# Patient Record
Sex: Female | Born: 1995 | Race: Black or African American | Hispanic: No | Marital: Single | Smoking: Current every day smoker
Health system: Southern US, Community
[De-identification: ages and names within clinical notes are randomized; demographics above are authoritative.]

---

## 2017-06-11 ENCOUNTER — Emergency Department (HOSPITAL_COMMUNITY)
Admission: EM | Admit: 2017-06-11 | Discharge: 2017-06-11 | Disposition: A | Discharge status: Home / Self Care | Payer: Self-pay | Attending: Emergency Medicine | Admitting: Emergency Medicine

## 2017-06-11 ENCOUNTER — Emergency Department (HOSPITAL_COMMUNITY): Payer: Self-pay

## 2017-06-11 ENCOUNTER — Encounter (HOSPITAL_COMMUNITY): Payer: Self-pay | Admitting: Emergency Medicine

## 2017-06-11 DIAGNOSIS — S43004A Unspecified dislocation of right shoulder joint, initial encounter: Secondary | ICD-10-CM | POA: Insufficient documentation

## 2017-06-11 DIAGNOSIS — Y939 Activity, unspecified: Secondary | ICD-10-CM | POA: Insufficient documentation

## 2017-06-11 DIAGNOSIS — F17200 Nicotine dependence, unspecified, uncomplicated: Secondary | ICD-10-CM | POA: Insufficient documentation

## 2017-06-11 DIAGNOSIS — Y999 Unspecified external cause status: Secondary | ICD-10-CM | POA: Insufficient documentation

## 2017-06-11 DIAGNOSIS — W19XXXA Unspecified fall, initial encounter: Secondary | ICD-10-CM | POA: Insufficient documentation

## 2017-06-11 DIAGNOSIS — Y929 Unspecified place or not applicable: Secondary | ICD-10-CM | POA: Insufficient documentation

## 2017-06-11 MED ORDER — HYDROMORPHONE HCL 1 MG/ML IJ SOLN
1.0000 mg | Freq: Once | INTRAMUSCULAR | Status: AC
  Administered 2017-06-11: 1 mg via INTRAVENOUS
  Filled 2017-06-11: qty 1

## 2017-06-11 MED ORDER — HYDROCODONE-ACETAMINOPHEN 5-325 MG PO TABS: 1.0000 | ORAL_TABLET | Freq: Four times a day (QID) | ORAL | 0 refills | Status: AC | PRN

## 2017-06-11 MED ORDER — NAPROXEN 250 MG PO TABS: ORAL_TABLET | ORAL | 0 refills | Status: DC

## 2017-06-11 MED ORDER — ONDANSETRON HCL 4 MG/2ML IJ SOLN
4.0000 mg | Freq: Once | INTRAMUSCULAR | Status: AC
  Administered 2017-06-11: 4 mg via INTRAVENOUS
  Filled 2017-06-11: qty 2

## 2017-06-11 MED ORDER — PROPOFOL 10 MG/ML IV BOLUS
200.0000 mg | Freq: Once | INTRAVENOUS | Status: AC
  Administered 2017-06-11: 40 mg via INTRAVENOUS
  Filled 2017-06-11: qty 20

## 2017-06-11 NOTE — ED Notes (Signed)
Bed: [id number] Expected date:  Expected time:  Means of arrival:  Comments: Shoulder injury

## 2017-06-11 NOTE — ED Notes (Signed)
Pt removed her right arm sling.
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
## 2017-06-11 NOTE — ED Notes (Signed)
Another dose of 20 mg of propofol given at this time (for a total of 100 mg given).
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
## 2017-06-11 NOTE — Discharge Instructions (Signed)
Wear the shoulder immobilizer for the next 2 weeks. Take the medications as prescribed. You need to follow through to see an orthopedist to have your shoulder repaired.

## 2018-02-18 ENCOUNTER — Emergency Department (HOSPITAL_COMMUNITY): Payer: Self-pay

## 2018-02-18 ENCOUNTER — Emergency Department (HOSPITAL_COMMUNITY)
Admission: EM | Admit: 2018-02-18 | Discharge: 2018-02-18 | Disposition: A | Discharge status: Home / Self Care | Payer: Self-pay | Attending: Emergency Medicine | Admitting: Emergency Medicine

## 2018-02-18 ENCOUNTER — Encounter (HOSPITAL_COMMUNITY): Payer: Self-pay

## 2018-02-18 ENCOUNTER — Other Ambulatory Visit: Payer: Self-pay

## 2018-02-18 DIAGNOSIS — S43004A Unspecified dislocation of right shoulder joint, initial encounter: Secondary | ICD-10-CM

## 2018-02-18 DIAGNOSIS — Y929 Unspecified place or not applicable: Secondary | ICD-10-CM | POA: Insufficient documentation

## 2018-02-18 DIAGNOSIS — W1789XA Other fall from one level to another, initial encounter: Secondary | ICD-10-CM | POA: Insufficient documentation

## 2018-02-18 DIAGNOSIS — Y999 Unspecified external cause status: Secondary | ICD-10-CM | POA: Insufficient documentation

## 2018-02-18 DIAGNOSIS — F17200 Nicotine dependence, unspecified, uncomplicated: Secondary | ICD-10-CM | POA: Insufficient documentation

## 2018-02-18 DIAGNOSIS — Y939 Activity, unspecified: Secondary | ICD-10-CM | POA: Insufficient documentation

## 2018-02-18 MED ORDER — PROPOFOL 10 MG/ML IV BOLUS
60.0000 mg | Freq: Once | INTRAVENOUS | Status: AC
Start: 2018-02-18 — End: 2018-02-18
  Administered 2018-02-18: 60 mg via INTRAVENOUS
  Filled 2018-02-18: qty 20

## 2018-02-18 MED ORDER — FENTANYL CITRATE (PF) 100 MCG/2ML IJ SOLN
150.0000 ug | Freq: Once | INTRAMUSCULAR | Status: AC
  Administered 2018-02-18: 150 ug via INTRAVENOUS
  Filled 2018-02-18: qty 4

## 2018-02-18 NOTE — Discharge Instructions (Signed)
Please follow up with an ortho. You will likely need to have a repair.  Return for repeat episode.   Take 4 over the counter ibuprofen tablets 3 times a day or 2 over-the-counter naproxen tablets twice a day for pain. Also take tylenol 1000mg (2 extra strength) four times a day.
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
## 2018-07-25 ENCOUNTER — Emergency Department (HOSPITAL_COMMUNITY)
Admission: EM | Admit: 2018-07-25 | Discharge: 2018-07-25 | Disposition: A | Discharge status: Home / Self Care | Payer: Self-pay | Attending: Emergency Medicine | Admitting: Emergency Medicine

## 2018-07-25 ENCOUNTER — Encounter (HOSPITAL_COMMUNITY): Payer: Self-pay

## 2018-07-25 ENCOUNTER — Ambulatory Visit (HOSPITAL_COMMUNITY)
Admission: EM | Admit: 2018-07-25 | Discharge: 2018-07-25 | Disposition: A | Discharge status: Home / Self Care | Payer: No Typology Code available for payment source | Source: Ambulatory Visit | Attending: Emergency Medicine | Admitting: Emergency Medicine

## 2018-07-25 DIAGNOSIS — Z0441 Encounter for examination and observation following alleged adult rape: Secondary | ICD-10-CM | POA: Diagnosis not present

## 2018-07-25 DIAGNOSIS — T7421XA Adult sexual abuse, confirmed, initial encounter: Secondary | ICD-10-CM | POA: Diagnosis not present

## 2018-07-25 MED ORDER — ULIPRISTAL ACETATE 30 MG PO TABS
30.0000 mg | ORAL_TABLET | Freq: Once | ORAL | Status: AC
  Administered 2018-07-25: 30 mg via ORAL
  Filled 2018-07-25: qty 1

## 2018-07-25 MED ORDER — AZITHROMYCIN 250 MG PO TABS
1000.0000 mg | ORAL_TABLET | Freq: Once | ORAL | Status: AC
  Administered 2018-07-25: 1000 mg via ORAL
  Filled 2018-07-25: qty 4

## 2018-07-25 MED ORDER — CEFTRIAXONE SODIUM 250 MG IJ SOLR
250.0000 mg | Freq: Once | INTRAMUSCULAR | Status: AC
  Administered 2018-07-25: 250 mg via INTRAMUSCULAR
  Filled 2018-07-25: qty 250

## 2018-07-25 MED ORDER — LIDOCAINE HCL (PF) 1 % IJ SOLN
0.9000 mL | Freq: Once | INTRAMUSCULAR | Status: AC
  Administered 2018-07-25: 0.9 mL
  Filled 2018-07-25: qty 5

## 2018-07-25 MED ORDER — METRONIDAZOLE 500 MG PO TABS
2000.0000 mg | ORAL_TABLET | Freq: Once | ORAL | Status: AC
  Administered 2018-07-25: 2000 mg via ORAL
  Filled 2018-07-25: qty 4
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
## 2018-07-25 NOTE — Discharge Instructions (Signed)
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 non-prescription drugs, or dietary supplements you use. Also tell them if you smoke, drink alcohol, or use illegal drugs. Some items may interact with your medicine. What should I watch for while using this medicine? Tell your doctor or health care  professional if your symptoms do not improve or if they get worse. Do not treat diarrhea with over the counter products. Contact your doctor if you have diarrhea that lasts more than 2 days or if it is severe and watery. If you are being treated for a sexually transmitted disease, avoid sexual contact until you have finished your treatment. Having sex can infect your sexual partner. Calcium may bind to this medicine and cause lung or kidney problems. Avoid calcium products while taking this medicine and for 48 hours after taking the last dose of this medicine. What side effects may  I notice from receiving this medicine? Side effects that you should report to your doctor or health care professional as soon as possible: -allergic reactions like skin rash, itching or hives, swelling of the face, lips, or tongue -breathing problems -fever, chills -irregular heartbeat -pain when passing urine -seizures -stomach pain, cramps -unusual bleeding, bruising -unusually weak or tired Side effects that usually do not require medical attention (report to your doctor or health care professional if they continue or are bothersome): -diarrhea -dizzy, drowsy -headache -nausea, vomiting -pain, swelling, irritation where injected -stomach upset -sweating This list may not describe all possible side effects. Call your doctor for medical advice about side effects. You may report side effects to FDA at 1-800-FDA-1088. Where should I keep my medicine? Keep out of the reach of children. Store at room temperature below 25 degrees C (77 degrees F). Protect from light. Throw away any unused vials after the expiration date. NOTE: This sheet is a summary. It may not cover all possible information. If you have questions about this medicine, talk to your doctor, pharmacist, or health care provider.  2017 Elsevier/Gold Standard (2014-06-03 09:14:54)
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
## 2018-07-25 NOTE — ED Notes (Signed)
Megan Greene, SANE nurse contacted.

## 2018-08-27 ENCOUNTER — Encounter (HOSPITAL_COMMUNITY): Payer: Self-pay | Admitting: Emergency Medicine

## 2018-08-27 ENCOUNTER — Emergency Department (HOSPITAL_COMMUNITY): Payer: Self-pay

## 2018-08-27 ENCOUNTER — Emergency Department (HOSPITAL_COMMUNITY)
Admission: EM | Admit: 2018-08-27 | Discharge: 2018-08-27 | Disposition: A | Discharge status: Home / Self Care | Payer: Self-pay | Attending: Emergency Medicine | Admitting: Emergency Medicine

## 2018-08-27 DIAGNOSIS — Y9389 Activity, other specified: Secondary | ICD-10-CM | POA: Insufficient documentation

## 2018-08-27 DIAGNOSIS — Y999 Unspecified external cause status: Secondary | ICD-10-CM | POA: Insufficient documentation

## 2018-08-27 DIAGNOSIS — Y92019 Unspecified place in single-family (private) house as the place of occurrence of the external cause: Secondary | ICD-10-CM | POA: Insufficient documentation

## 2018-08-27 DIAGNOSIS — X509XXA Other and unspecified overexertion or strenuous movements or postures, initial encounter: Secondary | ICD-10-CM | POA: Insufficient documentation

## 2018-08-27 DIAGNOSIS — F17200 Nicotine dependence, unspecified, uncomplicated: Secondary | ICD-10-CM | POA: Insufficient documentation

## 2018-08-27 DIAGNOSIS — S43004A Unspecified dislocation of right shoulder joint, initial encounter: Secondary | ICD-10-CM | POA: Insufficient documentation

## 2018-08-27 MED ORDER — FENTANYL CITRATE (PF) 100 MCG/2ML IJ SOLN
50.0000 ug | INTRAMUSCULAR | Status: DC | PRN
  Administered 2018-08-27: 50 ug via INTRAVENOUS
  Filled 2018-08-27: qty 2

## 2018-08-27 MED ORDER — HYDROCODONE-ACETAMINOPHEN 5-325 MG PO TABS: 1.0000 | ORAL_TABLET | Freq: Once | ORAL | Status: DC

## 2018-08-27 MED ORDER — ONDANSETRON HCL 4 MG/2ML IJ SOLN
4.0000 mg | Freq: Once | INTRAMUSCULAR | Status: AC
  Administered 2018-08-27: 4 mg via INTRAVENOUS
  Filled 2018-08-27: qty 2

## 2018-08-27 MED ORDER — SODIUM CHLORIDE 0.9 % IV BOLUS
1000.0000 mL | Freq: Once | INTRAVENOUS | Status: AC
  Administered 2018-08-27: 1000 mL via INTRAVENOUS

## 2018-08-27 MED ORDER — PROPOFOL 10 MG/ML IV BOLUS
28.8000 mg | Freq: Once | INTRAVENOUS | Status: AC
  Administered 2018-08-27: 2.88 mg via INTRAVENOUS
  Filled 2018-08-27: qty 20

## 2018-08-27 MED ORDER — HYDROCODONE-ACETAMINOPHEN 5-325 MG PO TABS: 1.0000 | ORAL_TABLET | Freq: Four times a day (QID) | ORAL | 0 refills | Status: AC | PRN

## 2018-08-27 MED ORDER — HYDROCODONE-ACETAMINOPHEN 5-325 MG PO TABS
2.0000 | ORAL_TABLET | Freq: Once | ORAL | Status: AC
  Administered 2018-08-27: 2 via ORAL
  Filled 2018-08-27: qty 2
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
## 2018-08-27 NOTE — ED Provider Notes (Signed)
Medical screening examination/treatment/procedure(s) were conducted as a shared visit with non-physician practitioner(s) and myself.  I personally evaluated the patient during the encounter. Briefly, the patient is a 22 y.o. female who presents to the ED with right shoulder dislocation.  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  Has been told that she needed surgery for this but has not been able to afford it.  Patient states that shoulder just came out with basic movement today.  Patient is neurovascularly intact on exam.  X-ray confirmed dislocation.  IV pain medicine was given and patient was unable to tolerate fares technique therefore propofol sedation was performed by myself and shoulder reduction was successfully done by physician assistant.  Postreduction x-rays showed improved alignment.  Patient was placed in a sling.  Patient told to follow-up with orthopedic surgery.  Patient neurovascularly intact after reduction.  Improved pain.  Hemodynamically stable throughout sedation and afterwards.  Patient did have family member that was concerned about our care as we did not provide pain relief fast enough.  Had a long discussion with family member and the patient and explained to them about the difficulty in arranging for procedural sedation and reduction.  Otherwise patient was discharged in good condition.  Given return precautions.  This chart was dictated using voice recognition software.  Despite best efforts to proofread,  errors can occur which can change the documentation meaning.    EKG Interpretation None        .Sedation Date/Time: 08/27/2018 3:52 PM Performed by: Virgina Norfolk, DO Authorized by: Virgina Norfolk, DO   Consent:    Consent obtained:  Written and verbal   Consent given by:  Patient   Risks discussed:  Allergic reaction, dysrhythmia, inadequate sedation, nausea, prolonged hypoxia resulting in  organ damage, prolonged sedation necessitating reversal, respiratory compromise necessitating ventilatory assistance and intubation and vomiting   Alternatives discussed:  Analgesia without sedation and anxiolysis Universal protocol:    Procedure explained and questions answered to patient or proxy's satisfaction: yes     Relevant documents present and verified: yes     Test results available and properly labeled: yes     Imaging studies available: yes     Immediately prior to procedure a time out was called: yes     Patient identity confirmation method:  Arm band and verbally with patient Indications:    Procedure performed:  Dislocation reduction   Procedure necessitating sedation performed by:  Physician performing sedation   Intended level of sedation:  Moderate (conscious sedation) Pre-sedation assessment:    Time since last food or drink:  4 hours   NPO status caution: urgency dictates proceeding with non-ideal NPO status     ASA classification: class 1 - normal, healthy patient     Neck mobility: normal     Mouth opening:  3 or more finger widths   Thyromental distance:  4 finger widths   Mallampati score:  I - soft palate, uvula, fauces, pillars visible   Pre-sedation assessments completed and reviewed: airway patency, cardiovascular function, hydration status, mental status, nausea/vomiting, pain level, respiratory function and temperature     Pre-sedation assessment completed:  08/27/2018 3:03 PM Immediate pre-procedure details:    Reviewed: vital signs, relevant labs/tests and NPO status     Verified: bag valve mask available, emergency equipment available, intubation equipment available, IV patency confirmed, oxygen available and suction available   Procedure details (see MAR for exact dosages):  Preoxygenation:  Nasal cannula   Sedation:  Propofol   Intra-procedure monitoring:  Blood pressure monitoring, cardiac monitor, continuous capnometry, continuous pulse oximetry,  frequent LOC assessments and frequent vital sign checks   Intra-procedure events: none     Intra-procedure management:  Fluid bolus   Total Provider sedation time (minutes):  10 Post-procedure details:    Post-sedation assessment completed:  08/27/2018 3:55 PM   Attendance: Constant attendance by certified staff until patient recovered     Recovery: Patient returned to pre-procedure baseline     Post-sedation assessments completed and reviewed: airway patency, cardiovascular function, hydration status, mental status, nausea/vomiting, pain level, respiratory function and temperature     Patient is stable for discharge or admission: yes     Patient tolerance:  Tolerated well, no immediate complications     Virgina Norfolk, DO 08/27/18 1853

## 2018-08-27 NOTE — ED Triage Notes (Addendum)
Pt c/o R shoulder pain, R shoulder "popped out" of place. Pt given Fentanyl by EMS.
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
## 2018-08-27 NOTE — Discharge Instructions (Signed)
You can take Tylenol or Ibuprofen as directed for pain. You can alternate Tylenol and Ibuprofen every 4 hours. If you take Tylenol at 1pm, then you can take Ibuprofen at 5pm. Then you can take Tylenol again at 9pm.   Take pain medication for severe or breakthrough pain.   Follow-up with referred orthopedic doctor for further evaluation.  Keep the sling on to help support and stabilize the shoulder.  Return to emergency department for worsening pain, numbness/weakness, discoloration of the shoulder or any other worsening or concerning symptoms.

## 2018-08-27 NOTE — ED Provider Notes (Signed)
[facility]-EMERGENCY DEPT Provider Note   CSN: [id number] Arrival date & time: 08/27/18  1352     History   Chief Complaint Chief Complaint  Patient presents with  . Shoulder Pain    r    HPI Megan Greene is a 22 y.o. female with possible history of shoulder dislocations who presents for evaluation of right shoulder pain.  Patient reports that she was sitting in her house and states that she moved in her right shoulder "popped out of place."  She denies any trauma, injury.  She reports that this is happened previously.  Last time was in April 2019.  She states she has been seen by an orthopedic doctor before but otherwise has not had any other follow-up.  She does not remember who she had seen previously.  Patient was given fentanyl in route.  She reports she is still having significant pain.  She states that she has good distal sensation to her fingertips but feels like the right shoulder is numb.  The history is provided by the patient.    History reviewed. No pertinent past medical history.  There are no active problems to display for this patient.   History reviewed. No pertinent surgical history.   OB History   None      Home Medications    Prior to Admission medications   Medication Sig Start Date End Date Taking? Authorizing Provider  HYDROcodone-acetaminophen (NORCO/VICODIN) 5-325 MG tablet Take 1 tablet by mouth every 6 (six) hours as needed. Patient not taking: Reported on 02/18/2018 06/11/17   Devoria Albe, MD  HYDROcodone-acetaminophen (NORCO/VICODIN) 5-325 MG tablet Take 1-2 tablets by mouth every 6 (six) hours as needed. 08/27/18   Maxwell Caul, PA-C  naproxen (NAPROSYN) 250 MG tablet Take 1 po BID with food prn pain Patient not taking: Reported on 02/18/2018 06/11/17   Devoria Albe, MD    Family History History reviewed. No pertinent family history.  Social History Social History   Tobacco Use  . Smoking status: Current Every Day  Smoker  . Smokeless tobacco: Never Used  Substance Use Topics  . Alcohol use: No  . Drug use: No     Allergies   Patient has no known allergies.   Review of Systems Review of Systems  Musculoskeletal:       Right shoulder pain  Neurological: Positive for numbness (right shoulder ).  All other systems reviewed and are negative.    Physical Exam Updated Vital Signs BP 124/85 (BP Location: Right Arm)   Pulse 78   Temp 98.4 F (36.9 C) (Oral)   Resp 12   Wt 56.7 kg   LMP 08/20/2018 (Exact Date)   SpO2 100%   BMI 19.58 kg/m   Physical Exam  Constitutional: She appears well-developed and well-nourished.  HENT:  Head: Normocephalic and atraumatic.  Eyes: Conjunctivae and EOM are normal. Right eye exhibits no discharge. Left eye exhibits no discharge. No scleral icterus.  Cardiovascular:  Pulses:      Radial pulses are 2+ on the right side, and 2+ on the left side.  Pulmonary/Chest: Effort normal and breath sounds normal.  Lungs clear to auscultation bilaterally.  Symmetric chest rise.  No wheezing, rales, rhonchi.  Musculoskeletal:  Right shoulder with obvious deformity.  No tenderness palpation right elbow, right wrist, right hand.  Patient able to move all 5 digits right hand without any difficulty.  Limited range of motion of right upper extremity secondary to pain.  Full range  of motion of left upper extremity with any difficulty.  No tenderness palpation noted left shoulder, left elbow, left wrist.  Neurological: She is alert.  Good distal sensation to the right digits.  Reports of decreased sensation noted in the right shoulder.  Skin: Skin is warm and dry. Capillary refill takes less than 2 seconds.  Good distal cap refill. RUE is not dusky in appearance or cool to touch.  Psychiatric: She has a normal mood and affect. Her speech is normal and behavior is normal.  Nursing note and vitals reviewed.    ED Treatments / Results  Labs (all labs ordered are listed,  but only abnormal results are displayed) Labs Reviewed - No data to display  EKG None  Radiology Dg Shoulder Right  Result Date: 08/27/2018 CLINICAL DATA:  Right shoulder deformity.  Popped out of place. EXAM: RIGHT SHOULDER - 2+ VIEW COMPARISON:  None. FINDINGS: Acute anterior shoulder dislocation. No acute fracture. Flattening of the superior posterolateral humeral head consistent with a Hill-Sachs lesion. No aggressive osseous lesion. IMPRESSION: Acute anterior shoulder dislocation. Electronically Signed   By: Elige Ko   On: 08/27/2018 15:18   Dg Shoulder Right Portable  Result Date: 08/27/2018 CLINICAL DATA:  Reduction of right shoulder dislocation EXAM: PORTABLE RIGHT SHOULDER COMPARISON:  None. FINDINGS: Interval reduction of right shoulder dislocation with satisfactory position of the humeral head relative to the glenoid. No acute fracture. No persistent dislocation. Normal acromioclavicular joint. IMPRESSION: Successful interval reduction of right shoulder dislocation with satisfactory position of the humeral head relative to the glenoid. Electronically Signed   By: Elige Ko   On: 08/27/2018 16:19    Procedures Reduction of dislocation Date/Time: 08/27/2018 3:40 PM Performed by: Maxwell Caul, PA-C Authorized by: Maxwell Caul, PA-C  Consent: Verbal consent obtained. Consent given by: patient Patient understanding: patient states understanding of the procedure being performed Patient consent: the patient's understanding of the procedure matches consent given Procedure consent: procedure consent matches procedure scheduled Relevant documents: relevant documents present and verified Test results: test results available and properly labeled Site marked: the operative site was marked Imaging studies: imaging studies available Patient identity confirmed: verbally with patient Time out: Immediately prior to procedure a "time out" was called to verify the correct  patient, procedure, equipment, support staff and site/side marked as required.  Sedation: Patient sedated: yes Sedatives: propofol Vitals: Vital signs were monitored during sedation.  Patient tolerance: Patient tolerated the procedure well with no immediate complications Comments: We initially had tried reduction of the right shoulder without sedation.  Patient was unable to tolerate procedure and was terminated due to pain.  Patient was sedated with propofol.  Please see ED attending note for full sedation no.  Once patient was sedated, one attempt at reduction was made which was successful.  Patient was immediately placed in arm sling for support and stabilization.    (including critical care time)  Medications Ordered in ED Medications  fentaNYL (SUBLIMAZE) injection 50 mcg (50 mcg Intravenous Given 08/27/18 1433)  propofol (DIPRIVAN) 10 mg/mL bolus/IV push 28.8 mg (2.88 mg Intravenous Given 08/27/18 1530)  sodium chloride 0.9 % bolus 1,000 mL (0 mLs Intravenous Stopped 08/27/18 1629)  ondansetron (ZOFRAN) injection 4 mg (4 mg Intravenous Given 08/27/18 1529)  HYDROcodone-acetaminophen (NORCO/VICODIN) 5-325 MG per tablet 2 tablet (2 tablets Oral Given 08/27/18 1631)     Initial Impression / Assessment and Plan / ED Course  I have reviewed the triage vital signs and the nursing notes.  Pertinent labs & imaging results that were available during my care of the patient were reviewed by me and considered in my medical decision making (see chart for details).     22 year old female who presents for evaluation of right shoulder dislocation.  Reports a previous history.  Was sitting and turn and felt her shoulder pop out of place.  Reports some numbness of the shoulder.  No distal sensory deficits. Patient is afebrile, non-toxic appearing, sitting comfortably on examination table. Vital signs reviewed and stable.  Radial pulse and distal cap refill intact without any difficulty.  Patient obvious  deformity to right shoulder.  Concern for right shoulder dislocation.  Will give x-ray.  Patient given fentanyl in route.  Will give additional dose of fentanyl.  X-ray reviewed.  It is positive for dislocation.  We will plan to reduce.  Dr. Lockie Mola and I went to the room to evaluate patient.  We attempted to immediately reduce the shoulder to reduce pain.  We are unsuccessful and had to stop given patient's inability to tolerate the pain.  We discussed with patient regarding sedation for reduction.  Patient agreed and consented.  Discussed with patient risk first benefits of sedation.  Patient accepted all risk first benefits and wishes for sedation for procedure.   Procedure performed as documented above.  Patient tolerated procedure well.  She was immediately placed in arm sling for support and stabilization.  Post reduction x-ray shows successful reduction.  Reevaluation patient.  She is alert and talking.  She is walking in the ED with any difficulty.  Vital signs are stable.  She is requesting something for pain.  We will give her p.o. pain medication.  Reevaluation.  Patient with good distal sensation, vascularization.  We will plan to give her outpatient orthopedic referral for her to follow-up with given recurrence shoulder dislocations.  Encourage at home supportive care measures.  Requesting something for pain to go home with.  Patient reviewed on PMP.  Last narcotic prescription was in April 2018.  Patient had ample opportunity for questions and discussion. All patient's questions were answered with full understanding. Strict return precautions discussed. Patient expresses understanding and agreement to plan.   Portions of this note were generated with Scientist, clinical (histocompatibility and immunogenetics). Dictation errors may occur despite best attempts at proofreading.   Final Clinical Impressions(s) / ED Diagnoses   Final diagnoses:  Closed dislocation of right shoulder, initial encounter    ED  Discharge Orders         Ordered    HYDROcodone-acetaminophen (NORCO/VICODIN) 5-325 MG tablet  Every 6 hours PRN     08/27/18 1634           Maxwell Caul, PA-C 08/28/18 0049    Virgina Norfolk, DO 08/29/18 1610

## 2019-04-22 ENCOUNTER — Encounter (HOSPITAL_COMMUNITY): Payer: Self-pay | Admitting: Emergency Medicine

## 2019-04-22 ENCOUNTER — Other Ambulatory Visit: Payer: Self-pay

## 2019-04-22 ENCOUNTER — Emergency Department (HOSPITAL_COMMUNITY)
Admission: EM | Admit: 2019-04-22 | Discharge: 2019-04-22 | Disposition: A | Discharge status: Home / Self Care | Payer: Self-pay | Attending: Emergency Medicine | Admitting: Emergency Medicine

## 2019-04-22 DIAGNOSIS — R519 Headache, unspecified: Secondary | ICD-10-CM

## 2019-04-22 DIAGNOSIS — F17200 Nicotine dependence, unspecified, uncomplicated: Secondary | ICD-10-CM | POA: Insufficient documentation

## 2019-04-22 DIAGNOSIS — R51 Headache: Secondary | ICD-10-CM | POA: Insufficient documentation

## 2019-04-22 MED ORDER — PROCHLORPERAZINE EDISYLATE 10 MG/2ML IJ SOLN
10.0000 mg | Freq: Once | INTRAMUSCULAR | Status: AC
  Administered 2019-04-22: 10 mg via INTRAMUSCULAR
  Filled 2019-04-22: qty 2

## 2019-04-22 MED ORDER — PROCHLORPERAZINE EDISYLATE 10 MG/2ML IJ SOLN: 10.0000 mg | Freq: Once | INTRAMUSCULAR | Status: DC

## 2019-04-22 MED ORDER — KETOROLAC TROMETHAMINE 60 MG/2ML IM SOLN
60.0000 mg | Freq: Once | INTRAMUSCULAR | Status: AC
  Administered 2019-04-22: 60 mg via INTRAMUSCULAR
  Filled 2019-04-22: qty 2

## 2019-04-22 NOTE — Discharge Instructions (Signed)
I have attached some information for local neurology offices.  I would follow-up if you continue having ongoing headaches. Return here for any new/acute changes.

## 2019-04-22 NOTE — ED Notes (Signed)
Patient called ride, will leave when they arrive.

## 2019-04-22 NOTE — ED Notes (Signed)
Bed: TX64 Expected date:  Expected time:  Means of arrival:  Comments: 34 F headache x 2 months

## 2019-04-22 NOTE — ED Provider Notes (Signed)
[facility]-EMERGENCY DEPT Provider Note   CSN: [id number] Arrival date & time: 04/22/19  0626    History   Chief Complaint Chief Complaint  Patient presents with  . Headache    HPI Megan Greene is a 23 y.o. female.     The history is provided by the patient and medical records.  Headache     23 y.o. F here with headache.  States this has been an intermittent issues for about 2-3 months.  States pain localized to left side of her head, throbbing in nature.  States she has associated photophobia and some intermittent nausea.  No numbness, weakness, confusion, neck pain, stiffness, dizziness, or fever.  States she has been taking Excedrin for headache which does provide relief for a while.  States she has never seen a neurologist.  No history of migraines.  Last dose of meds around 5am-- took excedrin, tylenol, benadryl, and oxycodone from "friend".  No past medical history on file.  There are no active problems to display for this patient.   History reviewed. No pertinent surgical history.   OB History   No obstetric history on file.      Home Medications    Prior to Admission medications   Medication Sig Start Date End Date Taking? Authorizing Provider  HYDROcodone-acetaminophen (NORCO/VICODIN) 5-325 MG tablet Take 1 tablet by mouth every 6 (six) hours as needed. Patient not taking: Reported on 02/18/2018 06/11/17   Devoria Albe, MD  HYDROcodone-acetaminophen (NORCO/VICODIN) 5-325 MG tablet Take 1-2 tablets by mouth every 6 (six) hours as needed. 08/27/18   Maxwell Caul, PA-C  naproxen (NAPROSYN) 250 MG tablet Take 1 po BID with food prn pain Patient not taking: Reported on 02/18/2018 06/11/17   Devoria Albe, MD    Family History No family history on file.  Social History Social History   Tobacco Use  . Smoking status: Current Every Day Smoker  . Smokeless tobacco: Never Used  Substance Use Topics  . Alcohol use: No  . Drug use: No      Allergies   Patient has no known allergies.   Review of Systems Review of Systems  Neurological: Positive for headaches.  All other systems reviewed and are negative.    Physical Exam Updated Vital Signs BP 115/88 (BP Location: Right Arm)   Pulse 86   Temp 98.2 F (36.8 C) (Oral)   Resp 16   SpO2 100%   Physical Exam Vitals signs and nursing note reviewed.  Constitutional:      General: She is not in acute distress.    Appearance: She is well-developed. She is not diaphoretic.  HENT:     Head: Normocephalic and atraumatic.     Right Ear: External ear normal.     Left Ear: External ear normal.  Eyes:     Conjunctiva/sclera: Conjunctivae normal.     Pupils: Pupils are equal, round, and reactive to light.  Neck:     Musculoskeletal: Full passive range of motion without pain, normal range of motion and neck supple. No neck rigidity.     Comments: No rigidity, no meningismus Cardiovascular:     Rate and Rhythm: Normal rate and regular rhythm.     Heart sounds: Normal heart sounds. No murmur.  Pulmonary:     Effort: Pulmonary effort is normal. No respiratory distress.     Breath sounds: Normal breath sounds. No wheezing or rhonchi.  Abdominal:     General: Bowel sounds are normal.  Palpations: Abdomen is soft.     Tenderness: There is no abdominal tenderness. There is no guarding.  Musculoskeletal: Normal range of motion.  Skin:    General: Skin is warm and dry.     Findings: No rash.  Neurological:     Mental Status: She is alert and oriented to person, place, and time.     Cranial Nerves: No cranial nerve deficit.     Sensory: No sensory deficit.     Motor: No tremor or seizure activity.     Comments: AAOx3, answering questions and following commands appropriately; equal strength UE and LE bilaterally; CN grossly intact; moves all extremities appropriately without ataxia; no focal neuro deficits or facial asymmetry appreciated  Psychiatric:        Behavior:  Behavior normal.        Thought Content: Thought content normal.      ED Treatments / Results  Labs (all labs ordered are listed, but only abnormal results are displayed) Labs Reviewed - No data to display  EKG None  Radiology No results found.  Procedures Procedures (including critical care time)  Medications Ordered in ED Medications  ketorolac (TORADOL) injection 60 mg (60 mg Intramuscular Given 04/22/19 0546)  prochlorperazine (COMPAZINE) injection 10 mg (10 mg Intramuscular Given 04/22/19 0546)     Initial Impression / Assessment and Plan / ED Course  I have reviewed the triage vital signs and the nursing notes.  Pertinent labs & imaging results that were available during my care of the patient were reviewed by me and considered in my medical decision making (see chart for details).  23 y.o. F here with headaches x2-3 months.  States usually resolves with excedrin but did not work last night.  States she has not been able to sleep due to pain.  Headache is left sided, throbbing, with associated photophobia and some mild nausea.  No vomiting, dizziness, confusion, numbness, neck pain/stiffness, or fever.  Patient is awake, alert, appropriately oriented.  She does not have any focal neurologic deficits.  No signs or symptoms concerning for meningitis.  Will give migraine cocktail and reassess.  6:31 AM Patient resting comfortably.  VSS.  Feel she is stable for discharge.  Have given her information for local neurology clinics for follow-up if needed for recurrent/persistent headaches.  She can return here for any new/acute changes.  Final Clinical Impressions(s) / ED Diagnoses   Final diagnoses:  Bad headache    ED Discharge Orders    None       Garlon HatchetSanders,  M, PA-C 04/22/19 [id number]    Shon BatonHorton, Courtney F, MD 04/22/19 [phone]

## 2019-04-22 NOTE — ED Triage Notes (Addendum)
Arrives via EMS from home. L sided headache for 2-3 months. Took Excedrin, Benadryl, Tylenol and a non-prescribed oxycodone with no relief around 9 pm last night.

## 2019-05-09 ENCOUNTER — Encounter (HOSPITAL_COMMUNITY): Payer: Self-pay | Admitting: Emergency Medicine

## 2019-05-09 ENCOUNTER — Other Ambulatory Visit: Payer: Self-pay

## 2019-05-09 ENCOUNTER — Emergency Department (HOSPITAL_COMMUNITY)
Admission: EM | Admit: 2019-05-09 | Discharge: 2019-05-10 | Disposition: A | Discharge status: Home / Self Care | Payer: Self-pay | Attending: Emergency Medicine | Admitting: Emergency Medicine

## 2019-05-09 DIAGNOSIS — H53149 Visual discomfort, unspecified: Secondary | ICD-10-CM | POA: Insufficient documentation

## 2019-05-09 DIAGNOSIS — F17200 Nicotine dependence, unspecified, uncomplicated: Secondary | ICD-10-CM | POA: Insufficient documentation

## 2019-05-09 DIAGNOSIS — M26622 Arthralgia of left temporomandibular joint: Secondary | ICD-10-CM | POA: Insufficient documentation

## 2019-05-09 NOTE — ED Triage Notes (Signed)
Patient complaining about head, neck, and back pain that has been going on for two months. Patient states that she has not taken anything for it.

## 2019-05-09 NOTE — ED Notes (Signed)
Bed: WTR6 Expected date:  Expected time:  Means of arrival:  Comments: 

## 2019-05-09 NOTE — ED Notes (Signed)
Bed: WA07 Expected date:  Expected time:  Means of arrival:  Comments: 

## 2019-05-10 LAB — POC URINE PREG, ED: Preg Test, Ur: NEGATIVE

## 2019-05-10 MED ORDER — DIAZEPAM 5 MG PO TABS: 5.0000 mg | ORAL_TABLET | Freq: Every evening | ORAL | 0 refills | Status: AC | PRN

## 2019-05-10 MED ORDER — KETOROLAC TROMETHAMINE 30 MG/ML IJ SOLN
INTRAMUSCULAR | Status: AC
  Administered 2019-05-10: 03:00:00
  Filled 2019-05-10: qty 1

## 2019-05-10 MED ORDER — DIAZEPAM 5 MG PO TABS
ORAL_TABLET | ORAL | Status: AC
  Administered 2019-05-10: 02:00:00
  Filled 2019-05-10: qty 1

## 2019-05-10 NOTE — ED Provider Notes (Deleted)
Patient eloped from the ED prior to me establishing care with her.   Rodney Booze, PA-C 05/10/19 0012

## 2019-05-10 NOTE — Discharge Instructions (Addendum)
Prescription given for Valium. Take medication as directed and do not operate machinery, drive a car, or work while taking this medication as it can make you drowsy.   Please follow up with your primary care provider within 5-7 days for re-evaluation of your symptoms. If you do not have a primary care provider, information for a healthcare clinic has been provided for you to make arrangements for follow up care. Please return to the emergency department for any new or worsening symptoms.

## 2019-05-10 NOTE — ED Provider Notes (Signed)
[facility]-EMERGENCY DEPT Provider Note   CSN: [id number] Arrival date & time: 05/09/19  2233    History   Chief Complaint No chief complaint on file.   HPI Megan Greene is a 23 y.o. female.     HPI   Patient is a 23 year old female with no known past medical history presents emergency department today complaining of left-sided headache and facial pain.  States she has pain just anterior to the left ear.  Pain is been present for the last several months.  She also has some pain in her teeth and states she has been clenching her teeth in order to relieve the pain.  Pain is severe in nature.  It feels throbbing in nature.  Denies associated fevers.  No upper respiratory symptoms.  States she has some photophobia.  No associated visual changes, numbness or weakness.  No joint nausea or vomiting.  She has been using Aleve at home which has helped her symptoms somewhat but has not resolved them.  Was seen in the ED for this on a prior occasion and had some improvement with medications given none.  History reviewed. No pertinent past medical history.  There are no active problems to display for this patient.   History reviewed. No pertinent surgical history.   OB History   No obstetric history on file.      Home Medications    Prior to Admission medications   Medication Sig Start Date End Date Taking? Authorizing Provider  diazepam (VALIUM) 5 MG tablet Take 1 tablet (5 mg total) by mouth at bedtime as needed for up to 7 doses for anxiety. 05/10/19   Melek Pownall S, PA-C  HYDROcodone-acetaminophen (NORCO/VICODIN) 5-325 MG tablet Take 1 tablet by mouth every 6 (six) hours as needed. Patient not taking: Reported on 02/18/2018 06/11/17   Devoria AlbeKnapp, Iva, MD  HYDROcodone-acetaminophen (NORCO/VICODIN) 5-325 MG tablet Take 1-2 tablets by mouth every 6 (six) hours as needed. 08/27/18   Maxwell CaulLayden, Lindsey A, PA-C  naproxen (NAPROSYN) 250 MG tablet Take 1 po BID with food prn  pain Patient not taking: Reported on 02/18/2018 06/11/17   Devoria AlbeKnapp, Iva, MD    Family History History reviewed. No pertinent family history.  Social History Social History   Tobacco Use  . Smoking status: Current Every Day Smoker  . Smokeless tobacco: Never Used  Substance Use Topics  . Alcohol use: No  . Drug use: No     Allergies   Patient has no known allergies.   Review of Systems Review of Systems  Constitutional: Negative for chills and fever.  HENT: Positive for dental problem.   Eyes: Positive for photophobia. Negative for pain, redness and visual disturbance.  Respiratory: Negative for shortness of breath.   Cardiovascular: Negative for chest pain.  Gastrointestinal: Negative for abdominal pain, nausea and vomiting.  Genitourinary: Negative for dysuria.  Musculoskeletal: Negative for neck pain.  Skin: Negative for rash.  Neurological: Positive for headaches. Negative for dizziness, weakness, light-headedness and numbness.     Physical Exam Updated Vital Signs BP 136/90   Pulse 73   Temp 99 F (37.2 C) (Oral)   Resp 20   Ht 5\' 7"  (1.702 m)   Wt 56.7 kg   LMP 05/05/2019   SpO2 100%   BMI 19.58 kg/m   Physical Exam Vitals signs and nursing note reviewed.  Constitutional:      General: She is not in acute distress.    Appearance: She is well-developed.  HENT:  Head: Normocephalic and atraumatic.     Comments: TTP to the TMJ that reproduces pain    Right Ear: Tympanic membrane normal.     Left Ear: Tympanic membrane normal.  Eyes:     Conjunctiva/sclera: Conjunctivae normal.  Neck:     Musculoskeletal: Neck supple.  Cardiovascular:     Rate and Rhythm: Normal rate and regular rhythm.     Pulses: Normal pulses.     Heart sounds: Normal heart sounds. No murmur.  Pulmonary:     Effort: Pulmonary effort is normal. No respiratory distress.     Breath sounds: Normal breath sounds.  Abdominal:     General: Bowel sounds are normal.     Palpations:  Abdomen is soft.     Tenderness: There is no abdominal tenderness.  Skin:    General: Skin is warm and dry.  Neurological:     Mental Status: She is alert.     Comments: Mental Status:  Alert, thought content appropriate, able to give a coherent history. Speech fluent without evidence of aphasia. Able to follow 2 step commands without difficulty.  Cranial Nerves:  II:  pupils equal, round, reactive to light III,IV, VI: ptosis not present, extra-ocular motions intact bilaterally  V,VII: smile symmetric, facial light touch sensation equal VIII: hearing grossly normal to voice  X: uvula elevates symmetrically  XI: bilateral shoulder shrug symmetric and strong XII: midline tongue extension without fassiculations Motor:  Normal tone. 5/5 strength of BUE and BLE major muscle groups including strong and equal grip strength and dorsiflexion/plantar flexion Sensory: light touch normal in all extremities.       ED Treatments / Results  Labs (all labs ordered are listed, but only abnormal results are displayed) Labs Reviewed  POC URINE PREG, ED    EKG None  Radiology No results found.  Procedures Procedures (including critical care time)  Medications Ordered in ED Medications  diazepam (VALIUM) 5 MG tablet (  Given 05/10/19 0150)  ketorolac (TORADOL) 30 MG/ML injection (  Given 05/10/19 0235)     Initial Impression / Assessment and Plan / ED Course  I have reviewed the triage vital signs and the nursing notes.  Pertinent labs & imaging results that were available during my care of the patient were reviewed by me and considered in my medical decision making (see chart for details).   Final Clinical Impressions(s) / ED Diagnoses   Final diagnoses:  Arthralgia of left temporomandibular joint   Patient presenting for evaluation of left-sided headache ongoing for the last several months.  No neuro symptoms associated with this.  She does have tenderness to the left TMJ region  which reproduces her pain.  In the ED, she was given Toradol and Valium.  On reassessment she states that she still has some mild pain but pain has significantly improved since onset.  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  [facility] and there are no red flags.  Advised to follow-up with PCP and to return if worse.  Also advised to follow-up with a dentist.  She voices understanding of the plan and reasons to return.  All questions answered.  Patient stable for discharge.  ED Discharge Orders         Ordered    diazepam (VALIUM) 5 MG tablet  At bedtime PRN     05/10/19 0310  Rodney Booze, PA-C 05/10/19 0310    Molpus, Jenny Reichmann, MD 05/10/19 [phone]

## 2019-05-10 NOTE — ED Notes (Signed)
Bed: WA06 Expected date:  Expected time:  Means of arrival:  Comments: 

## 2019-07-07 ENCOUNTER — Other Ambulatory Visit: Payer: Self-pay

## 2019-07-07 ENCOUNTER — Emergency Department (HOSPITAL_COMMUNITY): Payer: No Typology Code available for payment source

## 2019-07-07 ENCOUNTER — Emergency Department (HOSPITAL_COMMUNITY)
Admission: EM | Admit: 2019-07-07 | Discharge: 2019-07-07 | Disposition: A | Discharge status: Home / Self Care | Payer: No Typology Code available for payment source | Attending: Emergency Medicine | Admitting: Emergency Medicine

## 2019-07-07 ENCOUNTER — Encounter (HOSPITAL_COMMUNITY): Payer: Self-pay | Admitting: Radiology

## 2019-07-07 DIAGNOSIS — F17210 Nicotine dependence, cigarettes, uncomplicated: Secondary | ICD-10-CM | POA: Diagnosis not present

## 2019-07-07 DIAGNOSIS — Y9389 Activity, other specified: Secondary | ICD-10-CM | POA: Diagnosis not present

## 2019-07-07 DIAGNOSIS — Y999 Unspecified external cause status: Secondary | ICD-10-CM | POA: Diagnosis not present

## 2019-07-07 DIAGNOSIS — S80211A Abrasion, right knee, initial encounter: Secondary | ICD-10-CM | POA: Diagnosis not present

## 2019-07-07 DIAGNOSIS — S300XXA Contusion of lower back and pelvis, initial encounter: Secondary | ICD-10-CM | POA: Insufficient documentation

## 2019-07-07 DIAGNOSIS — S3013XA Contusion of flank (latus) region, initial encounter: Secondary | ICD-10-CM

## 2019-07-07 DIAGNOSIS — Z23 Encounter for immunization: Secondary | ICD-10-CM | POA: Diagnosis not present

## 2019-07-07 DIAGNOSIS — S301XXA Contusion of abdominal wall, initial encounter: Secondary | ICD-10-CM

## 2019-07-07 DIAGNOSIS — S3992XA Unspecified injury of lower back, initial encounter: Secondary | ICD-10-CM | POA: Diagnosis present

## 2019-07-07 DIAGNOSIS — Y92410 Unspecified street and highway as the place of occurrence of the external cause: Secondary | ICD-10-CM | POA: Insufficient documentation

## 2019-07-07 LAB — URINALYSIS, ROUTINE W REFLEX MICROSCOPIC
Bacteria, UA: NONE SEEN
Bilirubin Urine: NEGATIVE
Glucose, UA: NEGATIVE mg/dL
Ketones, ur: NEGATIVE mg/dL
Leukocytes,Ua: NEGATIVE
Nitrite: NEGATIVE
Protein, ur: NEGATIVE mg/dL
Specific Gravity, Urine: >1.046 — ABNORMAL HIGH (ref 1.005–1.030)
pH: 7 (ref 5.0–8.0)

## 2019-07-07 LAB — BASIC METABOLIC PANEL
Anion gap: 9 (ref 5–15)
BUN: 7 mg/dL (ref 6–20)
CO2: 27 mmol/L (ref 22–32)
Calcium: 9.2 mg/dL (ref 8.9–10.3)
Chloride: 103 mmol/L (ref 98–111)
Creatinine, Ser: 0.73 mg/dL (ref 0.44–1.00)
GFR calc Af Amer: >60 mL/min (ref >60)
GFR calc non Af Amer: >60 mL/min (ref >60)
Glucose, Bld: 93 mg/dL (ref 70–99)
Potassium: 3.6 mmol/L (ref 3.5–5.1)
Sodium: 139 mmol/L (ref 135–145)

## 2019-07-07 LAB — CBC WITH DIFFERENTIAL/PLATELET
Abs Immature Granulocytes: 0.04 10*3/uL (ref 0.00–0.07)
Basophils Absolute: 0 10*3/uL (ref 0.0–0.1)
Basophils Relative: 0 %
Eosinophils Absolute: 0.1 10*3/uL (ref 0.0–0.5)
Eosinophils Relative: 1 %
HCT: 39.7 % (ref 36.0–46.0)
Hemoglobin: 13 g/dL (ref 12.0–15.0)
Immature Granulocytes: 0 %
Lymphocytes Relative: 21 %
Lymphs Abs: 2.2 10*3/uL (ref 0.7–4.0)
MCH: 31.9 pg (ref 26.0–34.0)
MCHC: 32.7 g/dL (ref 30.0–36.0)
MCV: 97.3 fL (ref 80.0–100.0)
Monocytes Absolute: 0.7 10*3/uL (ref 0.1–1.0)
Monocytes Relative: 7 %
Neutro Abs: 7.1 10*3/uL (ref 1.7–7.7)
Neutrophils Relative %: 71 %
Platelets: 304 10*3/uL (ref 150–400)
RBC: 4.08 MIL/uL (ref 3.87–5.11)
RDW: 11.4 % — ABNORMAL LOW (ref 11.5–15.5)
WBC: 10.1 10*3/uL (ref 4.0–10.5)
nRBC: 0 % (ref 0.0–0.2)

## 2019-07-07 LAB — I-STAT BETA HCG BLOOD, ED (MC, WL, AP ONLY): I-stat hCG, quantitative: <5 m[IU]/mL (ref <5)

## 2019-07-07 MED ORDER — MORPHINE SULFATE (PF) 4 MG/ML IV SOLN
4.0000 mg | Freq: Once | INTRAVENOUS | Status: AC
  Administered 2019-07-07: 12:00:00 4 mg via INTRAVENOUS
  Filled 2019-07-07: qty 1

## 2019-07-07 MED ORDER — NAPROXEN 500 MG PO TABS: 500.0000 mg | ORAL_TABLET | Freq: Two times a day (BID) | ORAL | 0 refills | Status: AC

## 2019-07-07 MED ORDER — METHOCARBAMOL 500 MG PO TABS: 500.0000 mg | ORAL_TABLET | Freq: Two times a day (BID) | ORAL | 0 refills | Status: AC

## 2019-07-07 MED ORDER — SODIUM CHLORIDE 0.9 % IV BOLUS
1000.0000 mL | Freq: Once | INTRAVENOUS | Status: AC
  Administered 2019-07-07: 1000 mL via INTRAVENOUS

## 2019-07-07 MED ORDER — IOHEXOL 300 MG/ML  SOLN
100.0000 mL | Freq: Once | INTRAMUSCULAR | Status: AC | PRN
  Administered 2019-07-07: 100 mL via INTRAVENOUS

## 2019-07-07 MED ORDER — TETANUS-DIPHTH-ACELL PERTUSSIS 5-2.5-18.5 LF-MCG/0.5 IM SUSP
0.5000 mL | Freq: Once | INTRAMUSCULAR | Status: AC
  Administered 2019-07-07: 0.5 mL via INTRAMUSCULAR
  Filled 2019-07-07: qty 0.5
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
## 2019-07-07 NOTE — ED Notes (Signed)
Patient transported to X-ray 
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
## 2019-07-07 NOTE — Discharge Instructions (Signed)
Return to the ED with any concerns including abdominal pain, vomiting, blood in urine, difficulty breathing, pus/redness/drainage from wound/fever, or any other alarming symptoms

## 2019-08-25 ENCOUNTER — Other Ambulatory Visit: Payer: Self-pay

## 2019-08-25 ENCOUNTER — Encounter (HOSPITAL_COMMUNITY): Payer: Self-pay | Admitting: Emergency Medicine

## 2019-08-25 ENCOUNTER — Emergency Department (HOSPITAL_COMMUNITY)
Admission: EM | Admit: 2019-08-25 | Discharge: 2019-08-25 | Disposition: A | Discharge status: Home / Self Care | Payer: Self-pay | Attending: Emergency Medicine | Admitting: Emergency Medicine

## 2019-08-25 ENCOUNTER — Emergency Department (HOSPITAL_COMMUNITY): Payer: Self-pay

## 2019-08-25 DIAGNOSIS — F17200 Nicotine dependence, unspecified, uncomplicated: Secondary | ICD-10-CM | POA: Insufficient documentation

## 2019-08-25 DIAGNOSIS — N12 Tubulo-interstitial nephritis, not specified as acute or chronic: Secondary | ICD-10-CM | POA: Insufficient documentation

## 2019-08-25 DIAGNOSIS — Z79899 Other long term (current) drug therapy: Secondary | ICD-10-CM | POA: Insufficient documentation

## 2019-08-25 LAB — CBC WITH DIFFERENTIAL/PLATELET
Abs Immature Granulocytes: 0.2 10*3/uL — ABNORMAL HIGH (ref 0.00–0.07)
Basophils Absolute: 0.1 10*3/uL (ref 0.0–0.1)
Basophils Relative: 0 %
Eosinophils Absolute: 0 10*3/uL (ref 0.0–0.5)
Eosinophils Relative: 0 %
HCT: 37.4 % (ref 36.0–46.0)
Hemoglobin: 12.6 g/dL (ref 12.0–15.0)
Immature Granulocytes: 1 %
Lymphocytes Relative: 5 %
Lymphs Abs: 1.1 10*3/uL (ref 0.7–4.0)
MCH: 31.4 pg (ref 26.0–34.0)
MCHC: 33.7 g/dL (ref 30.0–36.0)
MCV: 93.3 fL (ref 80.0–100.0)
Monocytes Absolute: 2.7 10*3/uL — ABNORMAL HIGH (ref 0.1–1.0)
Monocytes Relative: 12 %
Neutro Abs: 19.1 10*3/uL — ABNORMAL HIGH (ref 1.7–7.7)
Neutrophils Relative %: 82 %
Platelets: 247 10*3/uL (ref 150–400)
RBC: 4.01 MIL/uL (ref 3.87–5.11)
RDW: 11.6 % (ref 11.5–15.5)
WBC: 23.1 10*3/uL — ABNORMAL HIGH (ref 4.0–10.5)
nRBC: 0 % (ref 0.0–0.2)

## 2019-08-25 LAB — COMPREHENSIVE METABOLIC PANEL
ALT: 20 U/L (ref 0–44)
AST: 22 U/L (ref 15–41)
Albumin: 3.6 g/dL (ref 3.5–5.0)
Alkaline Phosphatase: 92 U/L (ref 38–126)
Anion gap: 13 (ref 5–15)
BUN: 9 mg/dL (ref 6–20)
CO2: 23 mmol/L (ref 22–32)
Calcium: 8.5 mg/dL — ABNORMAL LOW (ref 8.9–10.3)
Chloride: 95 mmol/L — ABNORMAL LOW (ref 98–111)
Creatinine, Ser: 0.73 mg/dL (ref 0.44–1.00)
GFR calc Af Amer: >60 mL/min (ref >60)
GFR calc non Af Amer: >60 mL/min (ref >60)
Glucose, Bld: 146 mg/dL — ABNORMAL HIGH (ref 70–99)
Potassium: 3.2 mmol/L — ABNORMAL LOW (ref 3.5–5.1)
Sodium: 131 mmol/L — ABNORMAL LOW (ref 135–145)
Total Bilirubin: 0.8 mg/dL (ref 0.3–1.2)
Total Protein: 8.2 g/dL — ABNORMAL HIGH (ref 6.5–8.1)

## 2019-08-25 LAB — URINALYSIS, ROUTINE W REFLEX MICROSCOPIC
Bilirubin Urine: NEGATIVE
Glucose, UA: NEGATIVE mg/dL
Ketones, ur: NEGATIVE mg/dL
Nitrite: POSITIVE — AB
Protein, ur: 100 mg/dL — AB
Specific Gravity, Urine: 1.014 (ref 1.005–1.030)
WBC, UA: >50 WBC/hpf — ABNORMAL HIGH (ref 0–5)
pH: 6 (ref 5.0–8.0)

## 2019-08-25 LAB — I-STAT BETA HCG BLOOD, ED (MC, WL, AP ONLY): I-stat hCG, quantitative: 13.7 m[IU]/mL — ABNORMAL HIGH (ref <5)

## 2019-08-25 LAB — PREGNANCY, URINE: Preg Test, Ur: NEGATIVE

## 2019-08-25 LAB — LIPASE, BLOOD: Lipase: 18 U/L (ref 11–51)

## 2019-08-25 LAB — LACTIC ACID, PLASMA: Lactic Acid, Venous: 1 mmol/L (ref 0.5–1.9)

## 2019-08-25 MED ORDER — SUCRALFATE 1 G PO TABS
1.0000 g | ORAL_TABLET | Freq: Once | ORAL | Status: AC
  Administered 2019-08-25: 12:00:00 1 g via ORAL
  Filled 2019-08-25: qty 1

## 2019-08-25 MED ORDER — SODIUM CHLORIDE 0.9 % IV BOLUS
1000.0000 mL | Freq: Once | INTRAVENOUS | Status: AC
  Administered 2019-08-25: 12:00:00 1000 mL via INTRAVENOUS

## 2019-08-25 MED ORDER — CEPHALEXIN 500 MG PO CAPS: 500.0000 mg | ORAL_CAPSULE | Freq: Four times a day (QID) | ORAL | 0 refills | Status: AC

## 2019-08-25 MED ORDER — ALUM & MAG HYDROXIDE-SIMETH 200-200-20 MG/5ML PO SUSP
30.0000 mL | Freq: Once | ORAL | Status: AC
  Administered 2019-08-25: 30 mL via ORAL
  Filled 2019-08-25: qty 30

## 2019-08-25 MED ORDER — ONDANSETRON HCL 4 MG/2ML IJ SOLN
4.0000 mg | Freq: Once | INTRAMUSCULAR | Status: AC
  Administered 2019-08-25: 4 mg via INTRAVENOUS
  Filled 2019-08-25: qty 2

## 2019-08-25 MED ORDER — MORPHINE SULFATE (PF) 4 MG/ML IV SOLN
4.0000 mg | Freq: Once | INTRAVENOUS | Status: AC
  Administered 2019-08-25: 15:00:00 4 mg via INTRAVENOUS
  Filled 2019-08-25: qty 1

## 2019-08-25 MED ORDER — ONDANSETRON HCL 4 MG PO TABS: 4.0000 mg | ORAL_TABLET | Freq: Four times a day (QID) | ORAL | 0 refills | Status: AC

## 2019-08-25 MED ORDER — SODIUM CHLORIDE 0.9 % IV SOLN
1.0000 g | Freq: Once | INTRAVENOUS | Status: AC
  Administered 2019-08-25: 14:00:00 1 g via INTRAVENOUS
  Filled 2019-08-25: qty 10

## 2019-08-25 MED ORDER — ACETAMINOPHEN 500 MG PO TABS
1000.0000 mg | ORAL_TABLET | Freq: Once | ORAL | Status: AC
  Administered 2019-08-25: 17:00:00 1000 mg via ORAL
  Filled 2019-08-25: qty 2

## 2019-08-25 MED ORDER — ACETAMINOPHEN 500 MG PO TABS
1000.0000 mg | ORAL_TABLET | Freq: Once | ORAL | Status: AC
  Administered 2019-08-25: 1000 mg via ORAL
  Filled 2019-08-25: qty 2

## 2019-08-25 MED ORDER — FAMOTIDINE IN NACL 20-0.9 MG/50ML-% IV SOLN
20.0000 mg | Freq: Once | INTRAVENOUS | Status: AC
  Administered 2019-08-25: 12:00:00 20 mg via INTRAVENOUS
  Filled 2019-08-25: qty 50

## 2019-08-25 NOTE — ED Provider Notes (Signed)
  Physical Exam  BP 103/60   Pulse 87   Temp 98.3 F (36.8 C) (Oral)   Resp 16   LMP 07/20/2019 (Within Months) Comment: Neg preg test on 9/26  SpO2 100%   Physical Exam  ED Course/Procedures     Procedures  MDM  Care assumed at 3 pm. Patient here with chills, flank pain, fever.  Has some cough as well.  No COVID risk factors.  Patient was found to have a UTI and WBC was 23 and signout pending CT renal stone.  4:25 PM CT showed no renal stone or hydro or renal abscess. Felt better in the ED after abx. Denies any COVID exposure. Previous provider prescribed keflex. Stable for discharge       Drenda Freeze, MD 08/25/19 1625

## 2019-08-25 NOTE — ED Triage Notes (Signed)
Pt c/o cough, fever, vomiting and now weakness x 5 days. Pt states she is unable to eat or drink. States she has not had a covid test and has not been exposed to covid that she is aware of.

## 2019-08-25 NOTE — Discharge Instructions (Addendum)
You were given a prescription for antibiotics. Please take the antibiotic prescription fully.   Take nausea medication as prescribed.   You may treat your fevers with tylenol and motrin.   Please follow up with your primary care provider within 3-5 days for re-evaluation of your symptoms. If you do not have a primary care provider, information for a healthcare clinic has been provided for you to make arrangements for follow up care. Please return to the emergency department for any persistent fevers, uncontrolled pain, persistent vomiting, or any new or worsening symptoms.

## 2019-08-25 NOTE — ED Provider Notes (Signed)
[facility]-EMERGENCY DEPT Provider Note   CSN: [id number] Arrival date & time: 08/25/19  [id number]     History   Chief Complaint Chief Complaint  Patient presents with  . Fever  . Weakness  . Cough  . Emesis    HPI Megan FellowsJocelyn Greene is a 23 y.o. female.     HPI   Pt is a 23 y/o female with no known PMHx who presents to the ED today for eval of fevers, nv. States she started to have NV about 5 days ago. Since then she has developed fevers, headaches (only when coughing), body aches, lower back pain, a productive cough, midsternal chest pain/epigastric abd pain. The chest and abd pain seem to be worse after vomiting.   Denies shortness of breath.  She also reports constipation and frequnecy, denies diarrhea, dysuria, hematuria. Denies abnormal vaginal discharge or bleeding. Denies rhinorrhea, congestion, or sore throat.  Denies leg pain/swelling, hemoptysis, recent surgery, recent long travel, hormone use, personal hx of cancer, or hx of DVT/PE.   Denies any sick contacts. Denies unprotected intercourse in the last 6 months.   History reviewed. No pertinent past medical history.  There are no active problems to display for this patient.   History reviewed. No pertinent surgical history.   OB History   No obstetric history on file.      Home Medications    Prior to Admission medications   Medication Sig Start Date End Date Taking? Authorizing Provider  cephALEXin (KEFLEX) 500 MG capsule Take 1 capsule (500 mg total) by mouth 4 (four) times daily for 14 days. 08/25/19 09/08/19  Craven Crean S, PA-C  diazepam (VALIUM) 5 MG tablet Take 1 tablet (5 mg total) by mouth at bedtime as needed for up to 7 doses for anxiety. 05/10/19   Belina Mandile S, PA-C  HYDROcodone-acetaminophen (NORCO/VICODIN) 5-325 MG tablet Take 1 tablet by mouth every 6 (six) hours as needed. Patient not taking: Reported on 02/18/2018 06/11/17   Devoria AlbeKnapp, Iva, MD   HYDROcodone-acetaminophen (NORCO/VICODIN) 5-325 MG tablet Take 1-2 tablets by mouth every 6 (six) hours as needed. Patient not taking: Reported on 08/25/2019 08/27/18   Graciella FreerLayden, Lindsey A, PA-C  methocarbamol (ROBAXIN) 500 MG tablet Take 1 tablet (500 mg total) by mouth 2 (two) times daily. 07/07/19   Mabe, Latanya MaudlinMartha L, MD  naproxen (NAPROSYN) 500 MG tablet Take 1 tablet (500 mg total) by mouth 2 (two) times daily. 07/07/19   Mabe, Latanya MaudlinMartha L, MD  ondansetron (ZOFRAN) 4 MG tablet Take 1 tablet (4 mg total) by mouth every 6 (six) hours. 08/25/19   Gevin Perea S, PA-C    Family History No family history on file.  Social History Social History   Tobacco Use  . Smoking status: Current Every Day Smoker  . Smokeless tobacco: Never Used  Substance Use Topics  . Alcohol use: No  . Drug use: No     Allergies   Patient has no known allergies.   Review of Systems Review of Systems  Constitutional: Positive for fever.  HENT: Negative for ear pain and sore throat.   Eyes: Negative for pain and visual disturbance.  Respiratory: Positive for cough. Negative for shortness of breath.   Cardiovascular: Positive for chest pain.  Gastrointestinal: Positive for abdominal pain, nausea and vomiting. Negative for constipation and diarrhea.  Genitourinary: Negative for dysuria, hematuria, vaginal bleeding and vaginal discharge.  Musculoskeletal: Negative for back pain.  Skin: Negative for color change and rash.  Neurological: Negative for  seizures and syncope.  All other systems reviewed and are negative.    Physical Exam Updated Vital Signs BP 112/65   Pulse 91   Temp (!) 100.4 F (38 C) (Oral)   Resp 16   LMP 07/20/2019 (Within Months) Comment: Pt stated LMP was 2 mo ago, no chance of pregnancy.   SpO2 97%   Physical Exam Vitals signs and nursing note reviewed.  Constitutional:      General: She is not in acute distress.    Appearance: She is well-developed.  HENT:     Head: Normocephalic  and atraumatic.     Mouth/Throat:     Mouth: Mucous membranes are dry.  Eyes:     Conjunctiva/sclera: Conjunctivae normal.  Neck:     Musculoskeletal: Neck supple.  Cardiovascular:     Rate and Rhythm: Regular rhythm. Tachycardia present.     Heart sounds: No murmur.  Pulmonary:     Effort: Pulmonary effort is normal. No respiratory distress.     Breath sounds: Normal breath sounds. No wheezing, rhonchi or rales.  Abdominal:     General: Bowel sounds are normal.     Palpations: Abdomen is soft.     Tenderness: There is abdominal tenderness. There is right CVA tenderness. There is no left CVA tenderness, guarding or rebound.  Musculoskeletal:     Right lower leg: No edema.     Left lower leg: No edema.  Skin:    General: Skin is warm and dry.  Neurological:     Mental Status: She is alert.      ED Treatments / Results  Labs (all labs ordered are listed, but only abnormal results are displayed) Labs Reviewed  CBC WITH DIFFERENTIAL/PLATELET - Abnormal; Notable for the following components:      Result Value   WBC 23.1 (*)    Neutro Abs 19.1 (*)    Monocytes Absolute 2.7 (*)    Abs Immature Granulocytes 0.20 (*)    All other components within normal limits  COMPREHENSIVE METABOLIC PANEL - Abnormal; Notable for the following components:   Sodium 131 (*)    Potassium 3.2 (*)    Chloride 95 (*)    Glucose, Bld 146 (*)    Calcium 8.5 (*)    Total Protein 8.2 (*)    All other components within normal limits  URINALYSIS, ROUTINE W REFLEX MICROSCOPIC - Abnormal; Notable for the following components:   Color, Urine AMBER (*)    APPearance CLOUDY (*)    Hgb urine dipstick SMALL (*)    Protein, ur 100 (*)    Nitrite POSITIVE (*)    Leukocytes,Ua LARGE (*)    WBC, UA >50 (*)    Bacteria, UA MANY (*)    All other components within normal limits  I-STAT BETA HCG BLOOD, ED ([facility], [facility], [facility] ONLY) - Abnormal; Notable for the following components:   I-stat hCG, quantitative 13.7 (*)     All other components within normal limits  CULTURE, BLOOD (ROUTINE X 2)  CULTURE, BLOOD (ROUTINE X 2)  URINE CULTURE  LIPASE, BLOOD  LACTIC ACID, PLASMA  PREGNANCY, URINE  LACTIC ACID, PLASMA    EKG None  Radiology Dg Chest Portable 1 View  Result Date: 08/25/2019 CLINICAL DATA:  Cough, fever and vomiting. EXAM: PORTABLE CHEST 1 VIEW COMPARISON:  None FINDINGS: The heart size and mediastinal contours are within normal limits. Both lungs are clear. The visualized skeletal structures are unremarkable. IMPRESSION: No active disease. Electronically Signed  By: Kerby Moors M.D.   On: 08/25/2019 12:08    Procedures Procedures (including critical care time)  Medications Ordered in ED Medications  morphine 4 MG/ML injection 4 mg (has no administration in time range)  sodium chloride 0.9 % bolus 1,000 mL (0 mLs Intravenous Stopped 08/25/19 1341)  ondansetron (ZOFRAN) injection 4 mg (4 mg Intravenous Given 08/25/19 1134)  acetaminophen (TYLENOL) tablet 1,000 mg (1,000 mg Oral Given 08/25/19 1138)  alum & mag hydroxide-simeth (MAALOX/MYLANTA) 200-200-20 MG/5ML suspension 30 mL (30 mLs Oral Given 08/25/19 1137)  famotidine (PEPCID) IVPB 20 mg premix (0 mg Intravenous Stopped 08/25/19 1341)  sucralfate (CARAFATE) tablet 1 g (1 g Oral Given 08/25/19 1138)  cefTRIAXone (ROCEPHIN) 1 g in sodium chloride 0.9 % 100 mL IVPB (0 g Intravenous Stopped 08/25/19 1421)     Initial Impression / Assessment and Plan / ED Course  I have reviewed the triage vital signs and the nursing notes.  Pertinent labs & imaging results that were available during my care of the patient were reviewed by me and considered in my medical decision making (see chart for details).    Final Clinical Impressions(s) / ED Diagnoses   Final diagnoses:  Pyelonephritis   23 y/o female presenting with NV, abd pain, fever, and cough  On arrival febrile and tachycardic with normal bp and respiratory status.   CBC with  leukocytosis of 23k, no anemia. CMP with mild hypokalemia and hyponatremia, normal kidney function and electrolytes Lipase is negative Lactic acid is negative making sepsis less likely.  Beta hcg slightly positive suspect false positive Urine preg negative UA with hematuria, proteinuria.  Also with leukocytes, nitrites, 6-10 RBCs, greater than 50 WBCs, many bacteria and 11-20 squamous epithelial cells.    CXR negative   CT renal pending  Patient with fever and tachycardia on arrival.  Also with positive UA.  Symptoms consistent with pyelonephritis.  I doubt sepsis given negative lactic acid. I reassessed patient and discussed the likely diagnosis pyelonephritis and plan to obtain CT renal to r/o infected stone. Discussed possible admission for pyelonephritis however patient states she is feeling much improved after treatment today and has not vomited since yesterday. She feels that she would rather be discharged with antibiotics and antiemetics if the CT scan is negative. She agrees to return if her sxs are worse.   Care transitioned to Dr. Darl Householder at shift change pending ct  ED Discharge Orders         Ordered    cephALEXin (KEFLEX) 500 MG capsule  4 times daily     08/25/19 1449    ondansetron (ZOFRAN) 4 MG tablet  Every 6 hours     08/25/19 1449           Shabnam Ladd S, PA-C 08/25/19 1506    Malvin Johns, MD 08/26/19 [phone]

## 2019-08-26 ENCOUNTER — Telehealth (HOSPITAL_BASED_OUTPATIENT_CLINIC_OR_DEPARTMENT_OTHER): Payer: Self-pay | Admitting: Emergency Medicine

## 2019-08-26 LAB — BLOOD CULTURE ID PANEL (REFLEXED)

## 2019-08-27 ENCOUNTER — Telehealth: Payer: Self-pay | Admitting: *Deleted

## 2019-08-27 LAB — URINE CULTURE: Culture: >=100000 — AB

## 2019-08-27 NOTE — Progress Notes (Addendum)
Pharmacy - Antimicrobial Stewardship  21 YOF presented to [facility] ED with fever, N/V, body aches.  Dx with pyelonephritis and discharged on cephalexin.  Two issues: 1.  Urine cx growing ESBL E. Coli - cephalosporins resistant 2.  Bcx growing MSSE in one bottle of each set.  Less likely contaminant.  Susceptibilities pending - if susceptibilities are different for each isolate then likely contaminant.  Source would be unknown.  No mention of IVDU from 9/26 visit.   Contacted current EDP staffing [facility] ED but was instructed they do not take these results.  Called patient placement to see if they can help,  Stated patient can be called with instructions to return to ED  Plan: 1. Patient to be called back to ED for re-evaluation for MSSE in blood cultures and ESBL E. Coli pyelonephritis on ineffective antibiotic therapy. - for IV therapy - suggest meropenem 1gm IV q8h to cover both organisms pending work-up of blood cultures.   Addendum:   08/28/2019 8:41 AM Attempted to call number and no answer,  May not be in service.    Doreene Eland, PharmD, BCPS.   Work Cell: [phone] 08/27/2019 2:28 PM

## 2019-08-27 NOTE — Telephone Encounter (Signed)
Attempted to contact patient via phone to return to ED for further evaluation of (+) urine culture and (+) blood culture.  No answer and no voicemail at phone number on file.  Letter sent to address on file.

## 2019-08-28 ENCOUNTER — Telehealth: Payer: Self-pay | Admitting: *Deleted

## 2019-08-28 LAB — CULTURE, BLOOD (ROUTINE X 2)
Special Requests: ADEQUATE
Special Requests: ADEQUATE

## 2019-08-29 ENCOUNTER — Telehealth: Payer: Self-pay

## 2019-08-29 NOTE — Telephone Encounter (Signed)
[facility] ED 08/25/2019 Addressed by Wallace. Per Willy Eddy D
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
## 2021-05-02 IMAGING — DX DG CHEST 1V PORT
1 series · 1 of 1 positions shown · non-contrast
Comparison: None

CLINICAL DATA: Cough, fever and vomiting.

EXAM:
PORTABLE CHEST 1 VIEW

[chest ap]
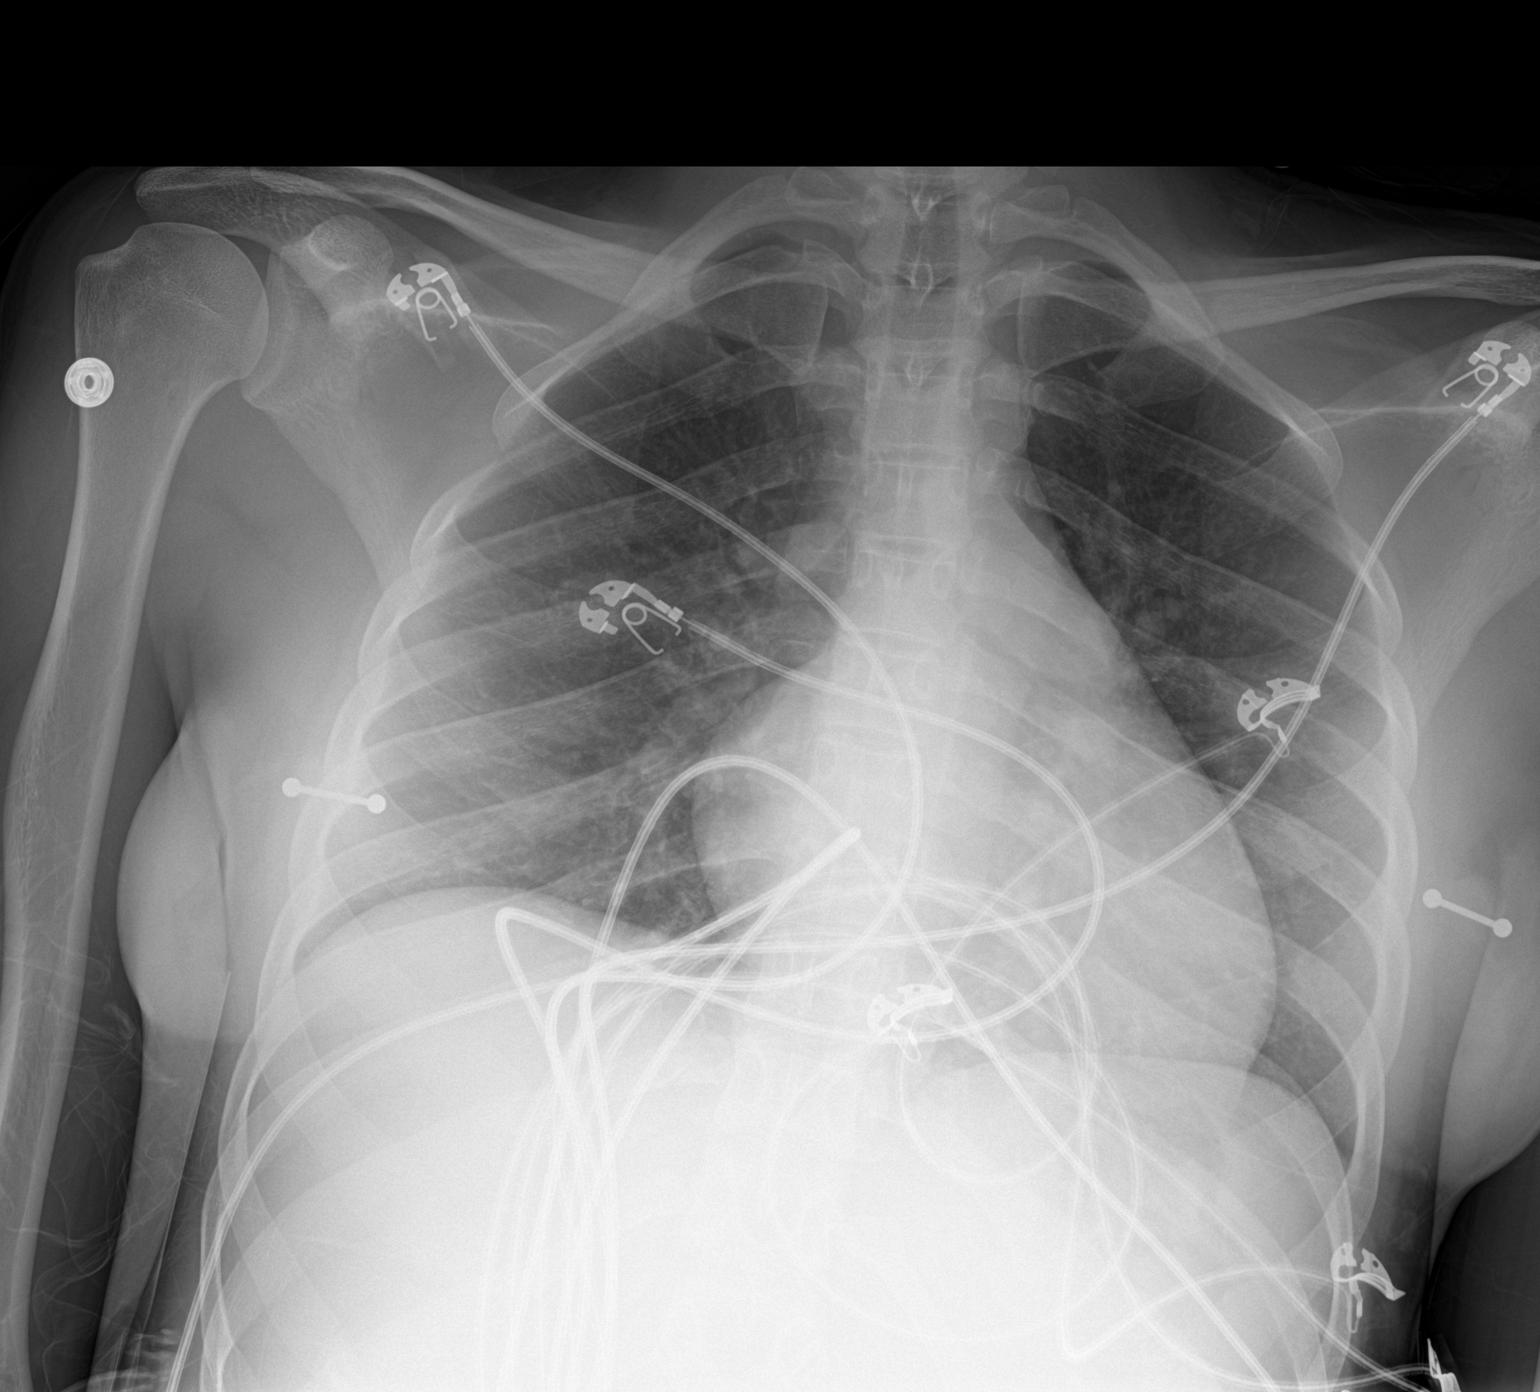

[1 of 1 positions shown; findings below may reference images not displayed]

FINDINGS: The heart size and mediastinal contours are within normal limits.
Both lungs are clear. The visualized skeletal structures are
unremarkable.
IMPRESSION: No active disease.

## 2021-06-14 ENCOUNTER — Other Ambulatory Visit: Payer: Self-pay

## 2021-06-14 ENCOUNTER — Ambulatory Visit (HOSPITAL_COMMUNITY)
Admission: EM | Admit: 2021-06-14 | Discharge: 2021-06-14 | Disposition: A | Discharge status: Home / Self Care | Payer: Medicaid Other | Attending: Urology | Admitting: Urology

## 2021-06-14 DIAGNOSIS — F1990 Other psychoactive substance use, unspecified, uncomplicated: Secondary | ICD-10-CM

## 2021-06-14 NOTE — Discharge Instructions (Addendum)

## 2021-06-14 NOTE — BH Assessment (Signed)
Triage note- ROUTINE- Pt  reporting daily use of about 1/4 gm fentanyl. She states her boyfriend was helped here and suggested pt come to [facility]. Pt reports last use yesterday. Pt denies current SI and no hx of past attempts. She denies HI and AVH.

## 2021-06-14 NOTE — ED Provider Notes (Signed)
[facility] Medical Screening Exam  Patient Name: Megan Greene MRN: [id number] Date of Evaluation: 06/14/21 Chief Complaint:   Diagnosis:  Final diagnoses:  Substance use disorder    History of Present illness: Megan Greene is a 25 y.o. female. Patient presented voluntarily to [facility] requesting resources for substance abuse treatment. Patient report that her boyfriend encouraged her to come to [facility] because he was previously assisted here. Patient was accompanied by her boyfriend Mr. Ace Gins [phone] who remained in assessment room with consent from patient.   Patient was assessed face to face by this NP. Patient is alert and orient, clam and cooperative, and in no apparent distress. Patient is speaking in clear tone at normal rate. Patient's thought process is coherent and relevant; she maintained good eye contact during assessment. There were no indications that patient is responding to any internal or external stimuli. Also there were no indications that patient is experiencing delusional thought content or paranoia.   Patient contracted for safety and reports that's she will be able to maintain safety if discharged home tonight. she denied history of self-harming, suicidal attempt, suicidal ideation, homicidal ideation, and hallucinations. She denied medical complaint, acute distress, chest pain, respiratoy distress, fever, headache, GI/GU concerns.  Patient report that she would like to seek treatment for substance abuse. She states "I want to get cleaned for my 74 years old son and for myself." Patient reports that she has been using fentanyl and heroine daily for 1 year; she report that she last used Fentanyl and heroine today 06/14/21 at around 2am. She report that she uses 0.25 gram of Fentanyl daily (about $50 worth). She report that she was abusing percocet prior to Fentanyl/heroine. 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 She also endorses marijuana use; she report that she uses marijuana "once every few months." She denies all other substance use including alcohol. She reports that she is currently unemployed and lives at home with her sister and her son.    Psychiatric Specialty Exam  Presentation  General Appearance:Appropriate for Environment  Eye Contact:Good  Speech:Clear and Coherent  Speech Volume:Normal  Handedness:Right   Mood and Affect  Mood:Euphoric  Affect:Congruent   Thought Process  Thought Processes:Coherent; Goal Directed  Descriptions of Associations:Intact  Orientation:Full (Time, Place and Person)  Thought Content:Logical    Hallucinations:None  Ideas of Reference:None  Suicidal Thoughts:No  Homicidal Thoughts:No   Sensorium  Memory:Immediate Good; Recent Good; Remote Good  Judgment:Good  Insight:Good   Executive Functions  Concentration:Good  Attention Span:Good  Recall:Good  Fund of Knowledge:Good  Language:Good   Psychomotor Activity  Psychomotor Activity:Normal   Assets  Assets:Communication Skills; Desire for Improvement; Financial Resources/Insurance; Housing; Intimacy; Social Support   Sleep  Sleep:Good  Number of hours:  No data recorded  No data recorded  Physical Exam: Physical Exam Vitals and nursing note reviewed.  Constitutional:      General: She is not in acute distress.    Appearance: She is well-developed. She is not ill-appearing.  HENT:     Head: Normocephalic and atraumatic.  Eyes:     Conjunctiva/sclera: Conjunctivae normal.  Cardiovascular:     Rate and Rhythm: Normal rate and regular rhythm.  Pulmonary:     Effort: Pulmonary effort is normal. No respiratory distress.     Breath sounds: Normal breath sounds.  Abdominal:     Palpations: Abdomen is soft.     Tenderness: There  is no abdominal tenderness.  Musculoskeletal:     Cervical back: Normal range of  motion and neck supple.  Skin:    General: Skin is warm and dry.  Neurological:     Mental Status: She is alert and oriented to person, place, and time.  Psychiatric:        Attention and Perception: Attention and perception normal. She is attentive. She does not perceive auditory or visual hallucinations.        Mood and Affect: Mood and affect normal. Mood is not anxious or depressed.        Speech: Speech normal.        Behavior: Behavior normal. Behavior is cooperative.        Thought Content: Thought content normal. Thought content is not paranoid or delusional. Thought content does not include homicidal or suicidal ideation. Thought content does not include homicidal or suicidal plan.        Cognition and Memory: Cognition normal.        Judgment: Judgment normal.   Review of Systems  Constitutional: Negative.   HENT: Negative.    Eyes: Negative.   Respiratory: Negative.    Cardiovascular: Negative.   Gastrointestinal: Negative.   Genitourinary: Negative.   Musculoskeletal: Negative.   Skin: Negative.   Neurological: Negative.   Endo/Heme/Allergies: Negative.   Psychiatric/Behavioral:  Positive for substance abuse. Negative for depression, hallucinations, memory loss and suicidal ideas. The patient is not nervous/anxious and does not have insomnia.   Blood pressure 109/74, pulse 69, temperature 98.4 F (36.9 C), temperature source Oral, resp. rate 16, SpO2 100 %. There is no height or weight on file to calculate BMI.  Musculoskeletal: Strength & Muscle Tone: within normal limits Gait & Station: normal Patient leans: Right   [facility] MSE Discharge Disposition for Follow up and Recommendations: Based on my evaluation the patient does not appear to have an emergency medical condition and can be discharged with resources and follow up care in outpatient services for Substance Abuse Intensive Outpatient Program Patient contracted for safety. Out patient substance abuse resources  were provided to patient.   Maricela Bo, NP 06/14/2021, 9:46 PM

## 2022-03-18 ENCOUNTER — Encounter (HOSPITAL_COMMUNITY): Payer: Self-pay

## 2022-03-18 ENCOUNTER — Ambulatory Visit (HOSPITAL_COMMUNITY)
Admission: EM | Admit: 2022-03-18 | Discharge: 2022-03-18 | Disposition: A | Discharge status: Home / Self Care | Payer: Medicaid Other | Attending: Nurse Practitioner | Admitting: Nurse Practitioner

## 2022-03-18 DIAGNOSIS — Z20822 Contact with and (suspected) exposure to covid-19: Secondary | ICD-10-CM | POA: Diagnosis not present

## 2022-03-18 DIAGNOSIS — R509 Fever, unspecified: Secondary | ICD-10-CM | POA: Diagnosis present

## 2022-03-18 LAB — POC INFLUENZA A AND B ANTIGEN (URGENT CARE ONLY)
INFLUENZA A ANTIGEN, POC: NEGATIVE
INFLUENZA B ANTIGEN, POC: NEGATIVE

## 2022-03-18 NOTE — ED Triage Notes (Signed)
Pt Presents for covid testing per her rehab facility after having a reported feer at home; pt currently undergoing outpatient rehab for drugs. ?

## 2022-03-18 NOTE — ED Provider Notes (Signed)
?[facility] ? ? ? ?CSN: [id number] ?Arrival date & time: 03/18/22  1222 ? ? ?  ? ?History   ?Chief Complaint ?Chief Complaint  ?Patient presents with  ? Covid Testing   ? ? ?HPI ?Megan Greene is a 26 y.o. female.  ? ?The patient is a 26 year old female who presents for COVID testing.  Patient is currently in rehab, and is ago, she reports she had a fever of 102.  She denies any other symptoms to include chills, headache, sore throat, nasal congestion, runny nose, cough, or GI symptoms.  She also reports that she has had decreased appetite.  She has not been vaccinated for COVID.  She states that the rehab facility is requiring a COVID test before she can reenter into the program.  States that she is feeling better since the onset of her fever.  She denies any fever since the 1 she had approximately 2 days ago.  She denies any sick contacts.  She took Tylenol for her fever, which provided good relief. ? ?The history is provided by the patient.  ? ?History reviewed. No pertinent past medical history. ? ?There are no problems to display for this patient. ? ? ?History reviewed. No pertinent surgical history. ? ?OB History   ?No obstetric history on file. ?  ? ? ? ?Home Medications   ? ?Prior to Admission medications   ?Medication Sig Start Date End Date Taking? Authorizing Provider  ?diazepam (VALIUM) 5 MG tablet Take 1 tablet (5 mg total) by mouth at bedtime as needed for up to 7 doses for anxiety. 05/10/19   Couture, Cortni S, PA-C  ?HYDROcodone-acetaminophen (NORCO/VICODIN) 5-325 MG tablet Take 1 tablet by mouth every 6 (six) hours as needed. ?Patient not taking: Reported on 02/18/2018 06/11/17   Devoria Albe, MD  ?HYDROcodone-acetaminophen (NORCO/VICODIN) 5-325 MG tablet Take 1-2 tablets by mouth every 6 (six) hours as needed. ?Patient not taking: Reported on 08/25/2019 08/27/18   Graciella Freer A, PA-C  ?methocarbamol (ROBAXIN) 500 MG tablet Take 1 tablet (500 mg total) by mouth 2 (two) times daily. 07/07/19    Mabe, Latanya Maudlin, MD  ?naproxen (NAPROSYN) 500 MG tablet Take 1 tablet (500 mg total) by mouth 2 (two) times daily. 07/07/19   Mabe, Latanya Maudlin, MD  ?ondansetron (ZOFRAN) 4 MG tablet Take 1 tablet (4 mg total) by mouth every 6 (six) hours. 08/25/19   Couture, Cortni S, PA-C  ? ? ?Family History ?Family History  ?Family history unknown: Yes  ? ? ?Social History ?Social History  ? ?Tobacco Use  ? Smoking status: Every Day  ? Smokeless tobacco: Never  ?Vaping Use  ? Vaping Use: Former  ?Substance Use Topics  ? Alcohol use: No  ? Drug use: No  ? ? ? ?Allergies   ?Patient has no known allergies. ? ? ?Review of Systems ?Review of Systems  ?Constitutional:  Positive for appetite change and fever.  ?HENT: Negative.    ?Eyes: Negative.   ?Respiratory: Negative.    ?Cardiovascular: Negative.   ?Gastrointestinal: Negative.   ?Skin: Negative.   ?Psychiatric/Behavioral: Negative.    ? ? ?Physical Exam ?Triage Vital Signs ?ED Triage Vitals  ?Enc Vitals Group  ?   BP 03/18/22 1303 132/85  ?   Pulse Rate 03/18/22 1303 (!) 103  ?   Resp 03/18/22 1303 18  ?   Temp 03/18/22 1303 98.3 ?F (36.8 ?C)  ?   Temp Source 03/18/22 1303 Oral  ?   SpO2 03/18/22 1303 98 %  ?  Weight --   ?   Height --   ?   Head Circumference --   ?   Peak Flow --   ?   Pain Score 03/18/22 1305 0  ?   Pain Loc --   ?   Pain Edu? --   ?   Excl. in GC? --   ? ?No data found. ? ?Updated Vital Signs ?BP 132/85 (BP Location: Right Arm)   Pulse (!) 103   Temp 98.3 ?F (36.8 ?C) (Oral)   Resp 18   LMP  (LMP Unknown)   SpO2 98%  ? ?Visual Acuity ?Right Eye Distance:   ?Left Eye Distance:   ?Bilateral Distance:   ? ?Right Eye Near:   ?Left Eye Near:    ?Bilateral Near:    ? ?Physical Exam ?Vitals reviewed.  ?Constitutional:   ?   Appearance: Normal appearance.  ?HENT:  ?   Head: Normocephalic and atraumatic.  ?   Right Ear: Tympanic membrane, ear canal and external ear normal.  ?   Left Ear: Tympanic membrane, ear canal and external ear normal.  ?   Nose: Nose normal.  ?    Mouth/Throat:  ?   Mouth: Mucous membranes are moist.  ?Eyes:  ?   Extraocular Movements: Extraocular movements intact.  ?   Conjunctiva/sclera: Conjunctivae normal.  ?   Pupils: Pupils are equal, round, and reactive to light.  ?Cardiovascular:  ?   Rate and Rhythm: Regular rhythm. Tachycardia present.  ?   Pulses: Normal pulses.  ?   Heart sounds: Normal heart sounds.  ?Pulmonary:  ?   Effort: Pulmonary effort is normal.  ?   Breath sounds: Normal breath sounds.  ?Abdominal:  ?   General: Bowel sounds are normal.  ?   Palpations: Abdomen is soft.  ?   Tenderness: There is no abdominal tenderness.  ?Musculoskeletal:  ?   Cervical back: Normal range of motion. No tenderness.  ?Skin: ?   General: Skin is warm and dry.  ?Neurological:  ?   General: No focal deficit present.  ?   Mental Status: She is alert and oriented to person, place, and time.  ?Psychiatric:     ?   Mood and Affect: Mood normal.     ?   Behavior: Behavior normal.  ? ? ? ?UC Treatments / Results  ?Labs ?(all labs ordered are listed, but only abnormal results are displayed) ?Labs Reviewed  ?SARS CORONAVIRUS 2 (TAT 6-24 HRS)  ?POC INFLUENZA A AND B ANTIGEN (URGENT CARE ONLY)  ? ? ?EKG ? ? ?Radiology ?No results found. ? ?Procedures ?Procedures (including critical care time) ? ?Medications Ordered in UC ?Medications - No data to display ? ?Initial Impression / Assessment and Plan / UC Course  ?I have reviewed the triage vital signs and the nursing notes. ? ?Pertinent labs & imaging results that were available during my care of the patient were reviewed by me and considered in my medical decision making (see chart for details). ? ?The patient is a 26 year old female who presents for COVID testing.  She states 2 days ago, she developed a fever.  Fever was as high as 102.  She took Tylenol, and her fever resolved.  She is also continues to have decreased appetite.  She denies any other symptoms.  Patient is currently in rehab and they are requiring a COVID  test before she returns to the program.  Unable to determine the likely cause of the patient's fever,  But Could Include a Viral Upper Respiratory Infection.  Her Influenza test Is negative. Test was performed, patient advised that she should receive results of that 6 to 24 hours.  She advised that she should see the results on her MyChart account if she has access.  Patient advised to continue Tylenol as needed.  Supportive care to include increasing fluids and getting plenty of rest.  Patient advised to follow-up if her symptoms worsen. ?Final Clinical Impressions(s) / UC Diagnoses  ? ?Final diagnoses:  ?Fever, unspecified  ? ? ? ?Discharge Instructions   ? ?  ?Your influenza test is negative.  You should receive your COVID results within the next 6 to 24 hours.  If you have access to MyChart, you will be able to see your results there. ?Continue Tylenol or ibuprofen for pain, fever, or general discomfort. ?Increase fluids and get plenty of rest. ?Follow-up as needed. ? ? ? ? ?ED Prescriptions   ?None ?  ? ?PDMP not reviewed this encounter. ?  ?Abran CantorLeath-Warren, Paisyn Guercio J, NP ?03/18/22 1403 ? ?

## 2022-03-18 NOTE — Discharge Instructions (Addendum)
Your influenza test is negative.  You should receive your COVID results within the next 6 to 24 hours.  If you have access to MyChart, you will be able to see your results there. ?Continue Tylenol or ibuprofen for pain, fever, or general discomfort. ?Increase fluids and get plenty of rest. ?Follow-up as needed. ?

## 2022-03-19 LAB — SARS CORONAVIRUS 2 (TAT 6-24 HRS): SARS Coronavirus 2: NEGATIVE

## 2024-07-09 ENCOUNTER — Emergency Department (HOSPITAL_COMMUNITY)

## 2024-07-09 ENCOUNTER — Other Ambulatory Visit: Payer: Self-pay

## 2024-07-09 ENCOUNTER — Emergency Department (HOSPITAL_COMMUNITY)
Admission: EM | Admit: 2024-07-09 | Discharge: 2024-07-09 | Disposition: A | Discharge status: Home / Self Care | Attending: Emergency Medicine | Admitting: Emergency Medicine

## 2024-07-09 ENCOUNTER — Encounter (HOSPITAL_COMMUNITY): Payer: Self-pay

## 2024-07-09 DIAGNOSIS — S43014A Anterior dislocation of right humerus, initial encounter: Secondary | ICD-10-CM | POA: Insufficient documentation

## 2024-07-09 DIAGNOSIS — M542 Cervicalgia: Secondary | ICD-10-CM | POA: Diagnosis not present

## 2024-07-09 DIAGNOSIS — Y92410 Unspecified street and highway as the place of occurrence of the external cause: Secondary | ICD-10-CM | POA: Insufficient documentation

## 2024-07-09 DIAGNOSIS — S43004A Unspecified dislocation of right shoulder joint, initial encounter: Secondary | ICD-10-CM

## 2024-07-09 DIAGNOSIS — S4991XA Unspecified injury of right shoulder and upper arm, initial encounter: Secondary | ICD-10-CM | POA: Diagnosis present

## 2024-07-09 DIAGNOSIS — F17200 Nicotine dependence, unspecified, uncomplicated: Secondary | ICD-10-CM | POA: Insufficient documentation

## 2024-07-09 LAB — CBC
HCT: 38.3 % (ref 36.0–46.0)
Hemoglobin: 12.6 g/dL (ref 12.0–15.0)
MCH: 30.9 pg (ref 26.0–34.0)
MCHC: 32.9 g/dL (ref 30.0–36.0)
MCV: 93.9 fL (ref 80.0–100.0)
Platelets: 373 K/uL (ref 150–400)
RBC: 4.08 MIL/uL (ref 3.87–5.11)
RDW: 12.5 % (ref 11.5–15.5)
WBC: 11.8 K/uL — ABNORMAL HIGH (ref 4.0–10.5)
nRBC: 0 % (ref 0.0–0.2)

## 2024-07-09 LAB — COMPREHENSIVE METABOLIC PANEL WITH GFR
ALT: 11 U/L (ref 0–44)
AST: 15 U/L (ref 15–41)
Albumin: 3.3 g/dL — ABNORMAL LOW (ref 3.5–5.0)
Alkaline Phosphatase: 57 U/L (ref 38–126)
Anion gap: 11 (ref 5–15)
BUN: 9 mg/dL (ref 6–20)
CO2: 25 mmol/L (ref 22–32)
Calcium: 9 mg/dL (ref 8.9–10.3)
Chloride: 102 mmol/L (ref 98–111)
Creatinine, Ser: 0.81 mg/dL (ref 0.44–1.00)
GFR, Estimated: >60 mL/min (ref >60)
Glucose, Bld: 115 mg/dL — ABNORMAL HIGH (ref 70–99)
Potassium: 4 mmol/L (ref 3.5–5.1)
Sodium: 138 mmol/L (ref 135–145)
Total Bilirubin: 0.7 mg/dL (ref 0.0–1.2)
Total Protein: 6.1 g/dL — ABNORMAL LOW (ref 6.5–8.1)

## 2024-07-09 LAB — HCG, SERUM, QUALITATIVE: Preg, Serum: NEGATIVE

## 2024-07-09 MED ORDER — SODIUM CHLORIDE 0.9 % IV BOLUS
1000.0000 mL | Freq: Once | INTRAVENOUS | Status: AC
  Administered 2024-07-09 (×2): 1000 mL via INTRAVENOUS

## 2024-07-09 MED ORDER — HYDROMORPHONE HCL 1 MG/ML IJ SOLN
1.0000 mg | Freq: Once | INTRAMUSCULAR | Status: AC
  Administered 2024-07-09 (×2): 1 mg via INTRAVENOUS
  Filled 2024-07-09: qty 1

## 2024-07-09 MED ORDER — PROPOFOL 10 MG/ML IV BOLUS
1.0000 mg/kg | Freq: Once | INTRAVENOUS | Status: DC
  Filled 2024-07-09: qty 20

## 2024-07-09 MED ORDER — PROPOFOL 10 MG/ML IV BOLUS
INTRAVENOUS | Status: AC | PRN
  Administered 2024-07-09 (×4): 35 mg via INTRAVENOUS

## 2024-07-09 NOTE — Progress Notes (Signed)
 Orthopedic Tech Progress Note Patient Details:  Megan Greene July 27, 1996 [id number]  Ortho Devices Type of Ortho Device: Sling immobilizer Ortho Device/Splint Location: rue Ortho Device/Splint Interventions: Ordered, Application, Adjustment  I applied sling post reduction. Post Interventions Patient Tolerated: Well Instructions Provided: Care of device, Adjustment of device  Chandra Dorn PARAS 07/09/2024, 1:10 AM
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
## 2024-07-09 NOTE — Discharge Instructions (Signed)
 You were evaluated in the Emergency Department and after careful evaluation, we did not find any emergent condition requiring admission or further testing in the hospital.  Your exam/testing today was overall reassuring.  Your symptoms were due to a shoulder dislocation.  We reduced your shoulder here in the emergency department.  It will be important to follow-up with the orthopedic specialists due to the shoulder instability you have been experiencing.  Use the sling for comfort.  Tylenol  or Motrin for pain.  Please return to the Emergency Department if you experience any worsening of your condition.  Thank you for allowing us  to be a part of your care.
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
## 2024-11-18 ENCOUNTER — Encounter (HOSPITAL_COMMUNITY): Payer: Self-pay

## 2024-11-18 ENCOUNTER — Emergency Department (HOSPITAL_COMMUNITY)

## 2024-11-18 ENCOUNTER — Emergency Department (HOSPITAL_COMMUNITY)
Admission: EM | Admit: 2024-11-18 | Discharge: 2024-11-18 | Disposition: A | Discharge status: Home / Self Care | Attending: Emergency Medicine | Admitting: Emergency Medicine

## 2024-11-18 ENCOUNTER — Other Ambulatory Visit: Payer: Self-pay

## 2024-11-18 DIAGNOSIS — S43014A Anterior dislocation of right humerus, initial encounter: Secondary | ICD-10-CM | POA: Insufficient documentation

## 2024-11-18 DIAGNOSIS — X501XXA Overexertion from prolonged static or awkward postures, initial encounter: Secondary | ICD-10-CM | POA: Insufficient documentation

## 2024-11-18 DIAGNOSIS — S4991XA Unspecified injury of right shoulder and upper arm, initial encounter: Secondary | ICD-10-CM | POA: Diagnosis present

## 2024-11-18 DIAGNOSIS — M24411 Recurrent dislocation, right shoulder: Secondary | ICD-10-CM

## 2024-11-18 MED ORDER — PROPOFOL 10 MG/ML IV BOLUS
INTRAVENOUS | Status: AC | PRN
  Administered 2024-11-18: 35 mg via INTRAVENOUS

## 2024-11-18 MED ORDER — PROPOFOL 10 MG/ML IV BOLUS
35.0000 mg | Freq: Once | INTRAVENOUS | Status: DC
  Filled 2024-11-18: qty 20

## 2024-11-18 MED ORDER — OXYCODONE-ACETAMINOPHEN 5-325 MG PO TABS
2.0000 | ORAL_TABLET | Freq: Once | ORAL | Status: AC
  Administered 2024-11-18: 2 via ORAL
  Filled 2024-11-18: qty 2

## 2024-11-18 MED ORDER — KETAMINE HCL 50 MG/5ML IJ SOSY
35.0000 mg | PREFILLED_SYRINGE | Freq: Once | INTRAMUSCULAR | Status: DC
  Filled 2024-11-18: qty 5

## 2024-11-18 MED ORDER — KETAMINE HCL 10 MG/ML IJ SOLN
INTRAMUSCULAR | Status: AC | PRN
  Administered 2024-11-18: 35 mg via INTRAVENOUS

## 2024-11-18 NOTE — ED Triage Notes (Signed)
 Pt arrives via POV. She states she dislocated her shoulder today. Reports it has been dislocated multiple times in the past. Pt AxOx4.

## 2024-11-18 NOTE — Discharge Instructions (Addendum)
 We evaluated you for your shoulder dislocation.  We reduced your shoulder.  Please follow-up with an orthopedic specialist given your recurrent dislocation.  You may need surgery to help prevent this from happening.  You can call Dr. Edna for follow up.   Please keep your sling on until you see the orthopedic doctor.  Return if you have any new dislocation.  Please take Tylenol  (acetaminophen ) and Motrin (ibuprofen) for your symptoms at home.  You can take 1000 mg of Tylenol  every 6 hours and 600 mg of Motrin every 6 hours as needed for your symptoms.  You can take these medicines together as needed, either at the same time, or alternating every 3 hours.

## 2024-11-18 NOTE — ED Provider Triage Note (Signed)
 Emergency Medicine Provider Triage Evaluation Note  Megan Greene , a 28 y.o. female  was evaluated in triage.  Pt complains of shoulder pain. Hx of recurrent R shoulder dislocation.  States today she was just reaching her arm upward and it dislocated.  Severe R shoulder pain, no neck pain or elbow pain.    Review of Systems  Positive: As above Negative: As above  Physical Exam  BP 136/85 (BP Location: Left Arm)   Pulse 89   Temp 97.7 F (36.5 C)   Resp 18   SpO2 99%  Gen:   Awake, tearful Resp:  Normal effort  MSK:   Moves extremities without difficulty  Other:  R shoulder close deformity   Medical Decision Making  Medically screening exam initiated at 4:00 PM.  Appropriate orders placed.  Megan Greene was informed that the remainder of the evaluation will be completed by another provider, this initial triage assessment does not replace that evaluation, and the importance of remaining in the ED until their evaluation is complete.  R shoulder dislocation   Megan Colon, PA-C 11/18/24 [id number]

## 2024-11-18 NOTE — Progress Notes (Addendum)
 Orthopedic Tech Progress Note Patient Details:  Megan Greene Dec 20, 1995 [id number] Conscious sedation/ reduction of R GH. Med team present for procedure. MD and PA performed reduction after I applied the sling. Ortho Devices Type of Ortho Device: Arm sling Ortho Device/Splint Location: R GH joint Ortho Device/Splint Interventions: Ordered, Application, Adjustment   Post Interventions Patient Tolerated: Well Instructions Provided: Care of device  Megan Greene 11/18/2024, 5:42 PM

## 2024-11-18 NOTE — ED Provider Notes (Signed)
 " [place] [facility] AT [facility] Provider Note  CSN: [id number] Arrival date & time: 11/18/24 1549  Chief Complaint(s) Shoulder Pain  HPI Megan Greene is a 28 y.o. female history of recurrent shoulder dislocation presenting to the emergency department with shoulder injury.  She reports that she was lying with her arm behind her head and felt a pop out of place.  She was not able to place back.  Reports typically happens around 4 times a month but not able to pop it in this time.  No other injuries or pain.  No numbness or tingling.   Past Medical History History reviewed. No pertinent past medical history. There are no active problems to display for this patient.  Home Medication(s) Prior to Admission medications  Medication Sig Start Date End Date Taking? Authorizing Provider  diazepam  (VALIUM ) 5 MG tablet Take 1 tablet (5 mg total) by mouth at bedtime as needed for up to 7 doses for anxiety. 05/10/19   Couture, Cortni S, PA-C  HYDROcodone -acetaminophen  (NORCO/VICODIN) 5-325 MG tablet Take 1 tablet by mouth every 6 (six) hours as needed. Patient not taking: Reported on 02/18/2018 06/11/17   Knapp, Iva, MD  HYDROcodone -acetaminophen  (NORCO/VICODIN) 5-325 MG tablet Take 1-2 tablets by mouth every 6 (six) hours as needed. Patient not taking: Reported on 08/25/2019 08/27/18   Layden, Lindsey A, PA-C  methocarbamol  (ROBAXIN ) 500 MG tablet Take 1 tablet (500 mg total) by mouth 2 (two) times daily. 07/07/19   Mabe, [place] CROME, MD  naproxen  (NAPROSYN ) 500 MG tablet Take 1 tablet (500 mg total) by mouth 2 (two) times daily. 07/07/19   Mabe, [place] CROME, MD  ondansetron  (ZOFRAN ) 4 MG tablet Take 1 tablet (4 mg total) by mouth every 6 (six) hours. 08/25/19   Couture, Cortni GORMAN, PA-C                                                                                                                                    Past Surgical History History reviewed. No pertinent surgical  history. Family History Family History  Family history unknown: Yes    Social History Social History[1] Allergies Patient has no known allergies.  Review of Systems Review of Systems  All other systems reviewed and are negative.   Physical Exam Vital Signs  I have reviewed the triage vital signs BP 104/77   Pulse 65   Temp 98.2 F (36.8 C) (Oral)   Resp 16   SpO2 97%  Physical Exam Vitals and nursing note reviewed.  Constitutional:      Appearance: Normal appearance.  HENT:     Head: Normocephalic and atraumatic.     Mouth/Throat:     Mouth: Mucous membranes are moist.  Eyes:     Conjunctiva/sclera: Conjunctivae normal.  Cardiovascular:     Rate and Rhythm: Normal rate.  Pulmonary:     Effort: Pulmonary effort is normal. No respiratory distress.  Abdominal:     General: Abdomen is flat.  Musculoskeletal:        General: Deformity present.     Comments: Palpable deformity of the right shoulder.  Normal distal neurologic exam and pulse  Skin:    General: Skin is warm and dry.     Capillary Refill: Capillary refill takes less than 2 seconds.  Neurological:     General: No focal deficit present.     Mental Status: She is alert. Mental status is at baseline.  Psychiatric:        Mood and Affect: Mood normal.        Behavior: Behavior normal.     ED Results and Treatments Labs (all labs ordered are listed, but only abnormal results are displayed) Labs Reviewed - No data to display                                                                                                                        Radiology DG Shoulder Right Portable Result Date: 11/18/2024 EXAM: 1 VIEW(S) XRAY OF THE RIGHT SHOULDER 11/18/2024 05:43:00 PM COMPARISON: 11/18/2024 at 4:21 PM CLINICAL HISTORY: Post reduction. FINDINGS: BONES AND JOINTS: Successful reduction of the glenohumeral joint dislocation. No visualized fracture. No malalignment. The [facility] joint is unremarkable. SOFT TISSUES:  No abnormal calcifications. Visualized lung is unremarkable. IMPRESSION: 1. Successful reduction of the glenohumeral joint dislocation. 2. No visualized fracture. Electronically signed by: Rogelia Myers MD 11/18/2024 05:48 PM EST RP Workstation: [id number]   DG Shoulder Right Result Date: 11/18/2024 CLINICAL DATA:  Shoulder dislocation. EXAM: DG SHOULDER 2+V*R* COMPARISON:  07/09/2024 FINDINGS: Anterior inferior dislocation of the humerus with respect to the glenoid. No evidence of Hill-Sachs or bony Bankart fracture. The acromioclavicular joint is normal. Unremarkable soft tissues. IMPRESSION: Anterior inferior shoulder dislocation. Electronically Signed   By: Andrea Gasman M.D.   On: 11/18/2024 16:41    Pertinent labs & imaging results that were available during my care of the patient were reviewed by me and considered in my medical decision making (see MDM for details).  Medications Ordered in ED Medications  propofol  (DIPRIVAN ) 10 mg/mL bolus/IV push 35 mg (has no administration in time range)  ketamine  50 mg in normal saline 5 mL (10 mg/mL) syringe (has no administration in time range)  oxyCODONE -acetaminophen  (PERCOCET/ROXICET) 5-325 MG per tablet 2 tablet (2 tablets Oral Given 11/18/24 1603)  ketamine  (KETALAR ) injection (35 mg Intravenous Given 11/18/24 1731)  propofol  (DIPRIVAN ) 10 mg/mL bolus/IV push (35 mg Intravenous Given 11/18/24 1732)  Procedures .Sedation  Date/Time: 11/18/2024 6:21 PM  Performed by: Francesca Elsie CROME, MD Authorized by: Francesca Elsie CROME, MD   Consent:    Consent obtained:  Verbal   Consent given by:  Patient   Risks discussed:  Allergic reaction, dysrhythmia, inadequate sedation, nausea, prolonged hypoxia resulting in organ damage, prolonged sedation necessitating reversal, respiratory compromise necessitating  ventilatory assistance and intubation and vomiting   Alternatives discussed:  Analgesia without sedation, anxiolysis and regional anesthesia Universal protocol:    Procedure explained and questions answered to patient or proxy's satisfaction: yes     Relevant documents present and verified: yes     Test results available: yes     Imaging studies available: yes     Required blood products, implants, devices, and special equipment available: yes     Site/side marked: yes     Immediately prior to procedure, a time out was called: yes     Patient identity confirmed:  Verbally with patient and arm band Indications:    Procedure performed:  Dislocation reduction   Procedure necessitating sedation performed by:  Physician performing sedation Pre-sedation assessment:    Time since last food or drink:  12 hr   ASA classification: class 1 - normal, healthy patient     Mouth opening:  3 or more finger widths   Thyromental distance:  4 finger widths   Mallampati score:  I - soft palate, uvula, fauces, pillars visible   Neck mobility: normal     Pre-sedation assessments completed and reviewed: airway patency, cardiovascular function, hydration status, mental status, nausea/vomiting, pain level, respiratory function and temperature   A pre-sedation assessment was completed prior to the start of the procedure Immediate pre-procedure details:    Reassessment: Patient reassessed immediately prior to procedure     Reviewed: vital signs, relevant labs/tests and NPO status     Verified: bag valve mask available, emergency equipment available, intubation equipment available, IV patency confirmed, oxygen available and suction available   Procedure details (see MAR for exact dosages):    Preoxygenation:  Nasal cannula   Sedation:  Propofol  and ketamine    Intended level of sedation: deep   Intra-procedure monitoring:  Blood pressure monitoring, cardiac monitor, continuous pulse oximetry, frequent LOC  assessments, frequent vital sign checks and continuous capnometry   Intra-procedure events: none     Total Provider sedation time (minutes):  15 Post-procedure details:   A post-sedation assessment was completed following the completion of the procedure.   Attendance: Constant attendance by certified staff until patient recovered     Recovery: Patient returned to pre-procedure baseline     Post-sedation assessments completed and reviewed: airway patency, cardiovascular function, hydration status, mental status, nausea/vomiting, pain level, respiratory function and temperature     Patient is stable for discharge or admission: yes     Procedure completion:  Tolerated well, no immediate complications .Ortho Injury Treatment  Date/Time: 11/18/2024 6:22 PM  Performed by: Francesca Elsie CROME, MD Authorized by: Francesca Elsie CROME, MD   Consent:    Consent obtained:  Verbal and written   Consent given by:  Patient   Risks discussed:  Fracture, recurrent dislocation, restricted joint movement, stiffness, nerve damage and irreducible dislocationInjury location: shoulder Location details: right shoulder Injury type: dislocation Dislocation type: anterior Chronicity: recurrent Pre-procedure neurovascular assessment: neurovascularly intact Pre-procedure distal perfusion: normal Pre-procedure neurological function: normal Pre-procedure range of motion: reduced  Anesthesia: Local anesthesia used: no  Patient sedated: Yes. Refer to sedation procedure documentation for details of  sedation. Manipulation performed: yes Reduction method: external rotation Reduction successful: yes X-ray confirmed reduction: yes Immobilization: sling Splint Applied by: ED Provider and Ortho Tech Post-procedure neurovascular assessment: post-procedure neurovascularly intact Post-procedure distal perfusion: normal Post-procedure neurological function: normal Post-procedure range of motion:  normal     (including critical care time)  Medical Decision Making / ED Course   MDM:  28 year old with dislocation of the right shoulder.  She reports that she has not eaten anything today.  She is not able to tolerate attempts at reduction without sedation.  Will sedate patient and reduce shoulder.  Clinical Course as of 11/18/24 MAURINE Repress Nov 18, 2024  1823 Reduction was performed successfully, confirmed on repeat x-ray.  Patient has returned to baseline mental status after sedation.  She is tolerating p.o. and ambulatory.  She is stable for discharge to home.  Recommended follow-up with orthopedic surgery given her recurrent dislocations. Will discharge patient to home. All questions answered. Patient comfortable with plan of discharge. Return precautions discussed with patient and specified on the after visit summary.  [WS]    Clinical Course User Index [WS] Francesca, Elsie CROME, MD     Additional history obtained: -Additional history obtained from friend -External records from outside source obtained and reviewed including: Chart review including previous notes, labs, imaging, consultation notes including prior er visits    Lab Tests:  Imaging Studies ordered: I ordered imaging studies including x-rays  On my interpretation imaging demonstrates dislocation/reduction of shoulder  I independently visualized and interpreted imaging. I agree with the radiologist interpretation   Medicines ordered and prescription drug management: Meds ordered this encounter  Medications   oxyCODONE -acetaminophen  (PERCOCET/ROXICET) 5-325 MG per tablet 2 tablet    Refill:  0   propofol  (DIPRIVAN ) 10 mg/mL bolus/IV push 35 mg   ketamine  50 mg in normal saline 5 mL (10 mg/mL) syringe   ketamine  (KETALAR ) injection   propofol  (DIPRIVAN ) 10 mg/mL bolus/IV push    -I have reviewed the patients home medicines and have made adjustments as needed    Reevaluation: After the interventions  noted above, I reevaluated the patient and found that their symptoms have resolved  Co morbidities that complicate the patient evaluation History reviewed. No pertinent past medical history.    Dispostion: Disposition decision including need for hospitalization was considered, and patient discharged from emergency department.    Final Clinical Impression(s) / ED Diagnoses Final diagnoses:  Recurrent dislocation of right shoulder     This chart was dictated using voice recognition software.  Despite best efforts to proofread,  errors can occur which can change the documentation meaning.     [1]  Social History Tobacco Use   Smoking status: Every Day   Smokeless tobacco: Never  Vaping Use   Vaping status: Former  Substance Use Topics   Alcohol use: No   Drug use: No     Francesca Elsie CROME, MD 11/18/24 1824  "
